# Patient Record
Sex: Male | Born: 1955
Health system: Southern US, Community
[De-identification: ages and names within clinical notes are randomized; demographics above are authoritative.]

## PROBLEM LIST (undated history)

## (undated) DIAGNOSIS — K429 Umbilical hernia without obstruction or gangrene: Secondary | ICD-10-CM

## (undated) DIAGNOSIS — K439 Ventral hernia without obstruction or gangrene: Secondary | ICD-10-CM

## (undated) DIAGNOSIS — E739 Lactose intolerance, unspecified: Secondary | ICD-10-CM

## (undated) DIAGNOSIS — E669 Obesity, unspecified: Secondary | ICD-10-CM

## (undated) DIAGNOSIS — K4020 Bilateral inguinal hernia, without obstruction or gangrene, not specified as recurrent: Secondary | ICD-10-CM

## (undated) DIAGNOSIS — I10 Essential (primary) hypertension: Secondary | ICD-10-CM

## (undated) DIAGNOSIS — K4090 Unilateral inguinal hernia, without obstruction or gangrene, not specified as recurrent: Secondary | ICD-10-CM

## (undated) DIAGNOSIS — J45909 Unspecified asthma, uncomplicated: Secondary | ICD-10-CM

## (undated) HISTORY — DX: Bilateral inguinal hernia, without obstruction or gangrene, not specified as recurrent: K40.20

## (undated) HISTORY — DX: Obesity, unspecified: E66.9

## (undated) HISTORY — DX: Ventral hernia without obstruction or gangrene: K43.9

---

## 2016-01-20 ENCOUNTER — Other Ambulatory Visit: Payer: Self-pay | Admitting: Family Medicine

## 2016-01-20 DIAGNOSIS — R911 Solitary pulmonary nodule: Secondary | ICD-10-CM

## 2016-02-01 ENCOUNTER — Ambulatory Visit
Admission: RE | Admit: 2016-02-01 | Discharge: 2016-02-01 | Disposition: A | Discharge status: Home / Self Care | Payer: Managed Care, Other (non HMO) | Source: Ambulatory Visit | Attending: Family Medicine | Admitting: Family Medicine

## 2016-02-01 DIAGNOSIS — R911 Solitary pulmonary nodule: Secondary | ICD-10-CM

## 2016-02-01 MED ORDER — IOPAMIDOL (ISOVUE-300) INJECTION 61%
75.0000 mL | Freq: Once | INTRAVENOUS | Status: AC | PRN
Start: 2016-02-01 — End: 2016-02-01
  Administered 2016-02-01: 75 mL via INTRAVENOUS

## 2016-02-22 ENCOUNTER — Ambulatory Visit: Payer: Self-pay | Admitting: Surgery

## 2016-02-22 NOTE — H&P (Signed)
Joshua Craig 02/22/2016 8:36 AM Location: [facility] Patient #: [id number] DOB: 1955-07-17 Married / Language: English / Race: White Male   History of Present Illness Ardeth Sportsman MD; 02/22/2016 9:31 AM) The patient is a 59 year old male who presents with an inguinal hernia. Note for "Inguinal hernia": Patient sent for surgical consultation by Dr. Duane Lope for concern of worsening inguinal hernia and new periumbilical hernia.  Pleasant active male. Works as a IT trainer. Has had a groin hernia for many years. He recall seeing another surgeon in the group perhaps 10 years ago. Open inguinal hernia repair offered. He held off. Its gradually gotten larger. He's been wearing a truss. Usually wears one out after year. However the truss is not working as well and is more comfortable to use. She noted he has had a new lump above his belly button as well. Concern for hernia there as well. Wish to reconsider hernia surgery. He is a smoker but is been using Chantix 3 months and is nearly cutback. His wife still smokes. He can walk several miles with his blood pounds on [place] without difficulty. No exertional chest pain or shortness of breath. He's never had any abdominal surgeries. No history of skin infections. He did have some chronic asthma chest tightness and bronchitis. Partially improved on doxycycline. Recent chest CT ruled out any concerning lung lesions. Hernias not seen as the cuts were higher.   Other Problems Lamar Laundry Bynum, CMA; 02/22/2016 8:36 AM) Asthma Chronic Obstructive Lung Disease High blood pressure Inguinal Hernia Umbilical Hernia Repair  Past Surgical History Gilmer Mor, CMA; 02/22/2016 8:36 AM) No pertinent past surgical history  Diagnostic Studies History Gilmer Mor, CMA; 02/22/2016 8:36 AM) Colonoscopy never  Allergies Lamar Laundry Bynum, CMA; 02/22/2016 8:37 AM) No Known Drug Allergies10/01/2016  Medication History (Alisha  Spillers, CMA; 02/22/2016 9:09 AM) Advair Diskus (250-50MCG/DOSE Aero Pow Br Act, Inhalation) Active. Lisinopril (20MG  Tablet, Oral) Active. Medications Reconciled Baby Aspirin (81MG  Tablet Chewable, Oral) Active. Multivitamin Adults (Oral) Active. Chantix (1MG  Tablet, Oral) Active.  Social History Gilmer Mor, CMA; 02/22/2016 8:36 AM) Alcohol use Occasional alcohol use. Caffeine use Coffee, Tea. Illicit drug use Remotely quit drug use. Tobacco use Current some day smoker.  Family History Gilmer Mor, CMA; 02/22/2016 8:36 AM) Arthritis Mother. Hypertension Mother.    Review of Systems Lamar Laundry Bynum CMA; 02/22/2016 8:36 AM) General Not Present- Appetite Loss, Chills, Fatigue, Fever, Night Sweats, Weight Gain and Weight Loss. Skin Not Present- Change in Wart/Mole, Dryness, Hives, Jaundice, New Lesions, Non-Healing Wounds, Rash and Ulcer. HEENT Not Present- Earache, Hearing Loss, Hoarseness, Nose Bleed, Oral Ulcers, Ringing in the Ears, Seasonal Allergies, Sinus Pain, Sore Throat, Visual Disturbances, Wears glasses/contact lenses and Yellow Eyes. Respiratory Not Present- Bloody sputum, Chronic Cough, Difficulty Breathing, Snoring and Wheezing. Breast Not Present- Breast Mass, Breast Pain, Nipple Discharge and Skin Changes. Cardiovascular Not Present- Chest Pain, Difficulty Breathing Lying Down, Leg Cramps, Palpitations, Rapid Heart Rate, Shortness of Breath and Swelling of Extremities. Gastrointestinal Not Present- Abdominal Pain, Bloating, Bloody Stool, Change in Bowel Habits, Chronic diarrhea, Constipation, Difficulty Swallowing, Excessive gas, Gets full quickly at meals, Hemorrhoids, Indigestion, Nausea, Rectal Pain and Vomiting. Male Genitourinary Not Present- Blood in Urine, Change in Urinary Stream, Frequency, Impotence, Nocturia, Painful Urination, Urgency and Urine Leakage. Musculoskeletal Not Present- Back Pain, Joint Pain, Joint Stiffness, Muscle Pain, Muscle Weakness and  Swelling of Extremities. Neurological Not Present- Decreased Memory, Fainting, Headaches, Numbness, Seizures, Tingling, Tremor, Trouble walking and Weakness. Psychiatric Not Present- Anxiety, Bipolar,  Change in Sleep Pattern, Depression, Fearful and Frequent crying. Endocrine Not Present- Cold Intolerance, Excessive Hunger, Hair Changes, Heat Intolerance, Hot flashes and New Diabetes. Hematology Not Present- Blood Thinners, Easy Bruising, Excessive bleeding, Gland problems, HIV and Persistent Infections.  Vitals (Sonya Bynum CMA; 02/22/2016 8:37 AM) 02/22/2016 8:37 AM Weight: 212 lb Height: 72in Body Surface Area: 2.18 m Body Mass Index: 28.75 kg/m  Temp.: 98.25F(Temporal)  Pulse: 77 (Regular)  BP: 130/74 (Sitting, Left Arm, Standard)       Physical Exam Ardeth Sportsman MD; 02/22/2016 9:25 AM) General Mental Status-Alert. General Appearance-Not in acute distress, Not Sickly. Orientation-Oriented X3. Hydration-Well hydrated. Voice-Normal.  Integumentary Global Assessment Upon inspection and palpation of skin surfaces of the - Axillae: non-tender, no inflammation or ulceration, no drainage. and Distribution of scalp and body hair is normal. General Characteristics Temperature - normal warmth is noted.  Head and Neck Head-normocephalic, atraumatic with no lesions or palpable masses. Face Global Assessment - atraumatic, no absence of expression. Neck Global Assessment - no abnormal movements, no bruit auscultated on the right, no bruit auscultated on the left, no decreased range of motion, non-tender. Trachea-midline. Thyroid Gland Characteristics - non-tender.  Eye Eyeball - Left-Extraocular movements intact, No Nystagmus. Eyeball - Right-Extraocular movements intact, No Nystagmus. Cornea - Left-No Hazy. Cornea - Right-No Hazy. Sclera/Conjunctiva - Left-No scleral icterus, No Discharge. Sclera/Conjunctiva - Right-No scleral icterus,  No Discharge. Pupil - Left-Direct reaction to light normal. Pupil - Right-Direct reaction to light normal.  ENMT Ears Pinna - Left - no drainage observed, no generalized tenderness observed. Right - no drainage observed, no generalized tenderness observed. Nose and Sinuses External Inspection of the Nose - no destructive lesion observed. Inspection of the nares - Left - quiet respiration. Right - quiet respiration. Mouth and Throat Lips - Upper Lip - no fissures observed, no pallor noted. Lower Lip - no fissures observed, no pallor noted. Nasopharynx - no discharge present. Oral Cavity/Oropharynx - Tongue - no dryness observed. Oral Mucosa - no cyanosis observed. Hypopharynx - no evidence of airway distress observed.  Chest and Lung Exam Inspection Movements - Normal and Symmetrical. Accessory muscles - No use of accessory muscles in breathing. Palpation Palpation of the chest reveals - Non-tender. Auscultation Breath sounds - Normal and Clear.  Cardiovascular Auscultation Rhythm - Regular. Murmurs & Other Heart Sounds - Auscultation of the heart reveals - No Murmurs and No Systolic Clicks.  Abdomen Inspection Inspection of the abdomen reveals - No Visible peristalsis and No Abnormal pulsations. Umbilicus - No Bleeding, No Urine drainage. Palpation/Percussion Palpation and Percussion of the abdomen reveal - Soft, Non Tender, No Rebound tenderness, No Rigidity (guarding) and No Cutaneous hyperesthesia. Note: Obese but soft. Mild diastases. Slight supraumbilical 4x4cm bulging asymmetry reduces down to a supraumbilical hernia. Mild laxity of the umbilical stalk but no true umbilical hernia there. Nontender, nondistended. No guarding.   Male Genitourinary Sexual Maturity Tanner 5 - Adult hair pattern and Adult penile size and shape.  Peripheral Vascular Upper Extremity Inspection - Left - No Cyanotic nailbeds, Not Ischemic. Right - No Cyanotic nailbeds, Not  Ischemic.  Neurologic Neurologic evaluation reveals -normal attention span and ability to concentrate, able to name objects and repeat phrases. Appropriate fund of knowledge , normal sensation and normal coordination. Mental Status Affect - not angry, not paranoid. Cranial Nerves-Normal Bilaterally. Gait-Normal.  Neuropsychiatric Mental status exam performed with findings of-able to articulate well with normal speech/language, rate, volume and coherence, thought content normal with ability to perform basic computations  and apply abstract reasoning and no evidence of hallucinations, delusions, obsessions or homicidal/suicidal ideation.  Musculoskeletal Global Assessment Spine, Ribs and Pelvis - no instability, subluxation or laxity. Right Upper Extremity - no instability, subluxation or laxity.  Lymphatic Head & Neck  General Head & Neck Lymphatics: Bilateral - Description - No Localized lymphadenopathy. Axillary  General Axillary Region: Bilateral - Description - No Localized lymphadenopathy. Femoral & Inguinal  Generalized Femoral & Inguinal Lymphatics: Left - Description - No Localized lymphadenopathy. Right - Description - No Localized lymphadenopathy.    Assessment & Plan Ardeth Sportsman(Analiah Drum C. Brailee Riede MD; 02/22/2016 9:28 AM) LEFT INGUINAL HERNIA (K40.90) Impression: Large left inguinal hernia, reducible. Mild impulse on right but no major hernia there.  I think he would benefit from repair. Offered laparoscopic exploration & repair of hernias found. I would have a low threshold to repair the contralateral side if there is any evidence of hernia there. He is interested in making sure that all potential hernias were repaired. PREOP - ING HERNIA - ENCOUNTER FOR PREOPERATIVE EXAMINATION FOR GENERAL SURGICAL PROCEDURE (Z01.818) Current Plans You are being scheduled for surgery - Our schedulers will call you.  You should hear from our office's scheduling department within 5 working days  about the location, date, and time of surgery. We try to make accommodations for patient's preferences in scheduling surgery, but sometimes the OR schedule or the surgeon's schedule prevents us from making those accommodations.  If you have not heard from our office [phone] in 5 working days, call the office and ask for your surgeon's nurse.  If you have other questions about your diagnosis, plan, or surgery, call the office and ask for your surgeon's nurse.  Written instructions provided The anatomy & physiology of the abdominal wall and pelvic floor was discussed. The pathophysiology of hernias in the inguinal and pelvic region was discussed. Natural history risks such as progressive enlargement, pain, incarceration, and strangulation was discussed. Contributors to complications such as smoking, obesity, diabetes, prior surgery, etc were discussed.  I feel the risks of no intervention will lead to serious problems that outweigh the operative risks; therefore, I recommended surgery to reduce and repair the hernia. I explained laparoscopic techniques with possible need for an open approach. I noted usual use of mesh to patch and/or buttress hernia repair  Risks such as bleeding, infection, abscess, need for further treatment, heart attack, death, and other risks were discussed. I noted a good likelihood this will help address the problem. Goals of post-operative recovery were discussed as well. Possibility that this will not correct all symptoms was explained. I stressed the importance of low-impact activity, aggressive pain control, avoiding constipation, & not pushing through pain to minimize risk of post-operative chronic pain or injury. Possibility of reherniation was discussed. We will work to minimize complications.  An educational handout further explaining the pathology & treatment options was given as well. Questions were answered. The patient expresses understanding & wishes to proceed  with surgery.  Pt Education - Pamphlet Given - Laparoscopic Hernia Repair: discussed with patient and provided information. Pt Education - [facility] Hernia Post-Op HCI ([place]ia Bradshaw): discussed with patient and provided information. EPIGASTRIC HERNIA (K43.9) Impression: Small but definite supraumbilical hernia. Eating larger. Sensitive.  I think he would benefit from repair. Given his activity and job requirements, most likely would do underlay mesh repair. Perhaps small enough just to do stitches. PREOP - VWH - ENCOUNTER FOR PREOPERATIVE EXAMINATION FOR GENERAL SURGICAL PROCEDURE (Z01.818) Current Plans You are being scheduled for surgery -  Our schedulers will call you.  You should hear from our office's scheduling department within 5 working days about the location, date, and time of surgery. We try to make accommodations for patient's preferences in scheduling surgery, but sometimes the OR schedule or the surgeon's schedule prevents [place] from making those accommodations.  If you have not heard from our office [phone]) in 5 working days, call the office and ask for your surgeon's nurse.  If you have other questions about your diagnosis, plan, or surgery, call the office and ask for your surgeon's nurse.  Written instructions provided The anatomy & physiology of the abdominal wall was discussed. The pathophysiology of hernias was discussed. Natural history risks without surgery including progeressive enlargement, pain, incarceration, & strangulation was discussed. Contributors to complications such as smoking, obesity, diabetes, prior surgery, etc were discussed.  I feel the risks of no intervention will lead to serious problems that outweigh the operative risks; therefore, I recommended surgery to reduce and repair the hernia. I explained laparoscopic techniques with possible need for an open approach. I noted the probable use of mesh to patch and/or buttress the hernia repair  Risks such as  bleeding, infection, abscess, need for further treatment, heart attack, death, and other risks were discussed. I noted a good likelihood this will help address the problem. Goals of post-operative recovery were discussed as well. Possibility that this will not correct all symptoms was explained. I stressed the importance of low-impact activity, aggressive pain control, avoiding constipation, & not pushing through pain to minimize risk of post-operative chronic pain or injury. Possibility of reherniation especially with smoking, obesity, diabetes, immunosuppression, and other health conditions was discussed. We will work to minimize complications.  An educational handout further explaining the pathology & treatment options was given as well. Questions were answered. The patient expresses understanding & wishes to proceed with surgery.  TOBACCO ABUSE (Z72.0) Current Plans Pt Education - [facility] STOP SMOKING!  Ardeth Sportsman, M.D., F.A.C.S. Gastrointestinal and Minimally Invasive Surgery [facility] [address] [place], [place] [address] [phone] Main / Paging

## 2016-04-12 ENCOUNTER — Encounter (HOSPITAL_COMMUNITY)
Admission: RE | Admit: 2016-04-12 | Discharge: 2016-04-12 | Disposition: A | Discharge status: Home / Self Care | Payer: Managed Care, Other (non HMO) | Source: Ambulatory Visit | Attending: Surgery | Admitting: Surgery

## 2016-04-12 ENCOUNTER — Ambulatory Visit (HOSPITAL_COMMUNITY)
Admission: RE | Admit: 2016-04-12 | Discharge: 2016-04-12 | Disposition: A | Discharge status: Home / Self Care | Payer: Managed Care, Other (non HMO) | Source: Ambulatory Visit | Attending: Anesthesiology | Admitting: Anesthesiology

## 2016-04-12 ENCOUNTER — Encounter (HOSPITAL_COMMUNITY): Payer: Self-pay

## 2016-04-12 DIAGNOSIS — R05 Cough: Secondary | ICD-10-CM | POA: Diagnosis present

## 2016-04-12 DIAGNOSIS — R059 Cough, unspecified: Secondary | ICD-10-CM

## 2016-04-12 HISTORY — DX: Essential (primary) hypertension: I10

## 2016-04-12 HISTORY — DX: Unspecified asthma, uncomplicated: J45.909

## 2016-04-12 HISTORY — DX: Lactose intolerance, unspecified: E73.9

## 2016-04-12 HISTORY — DX: Umbilical hernia without obstruction or gangrene: K42.9

## 2016-04-12 HISTORY — DX: Unilateral inguinal hernia, without obstruction or gangrene, not specified as recurrent: K40.90

## 2016-04-12 LAB — BASIC METABOLIC PANEL
ANION GAP: 7 (ref 5–15)
BUN: 10 mg/dL (ref 6–20)
CALCIUM: 9.5 mg/dL (ref 8.9–10.3)
CO2: 27 mmol/L (ref 22–32)
Chloride: 105 mmol/L (ref 101–111)
Creatinine, Ser: 0.95 mg/dL (ref 0.61–1.24)
GFR calc Af Amer: >60 mL/min (ref >60)
GLUCOSE: 90 mg/dL (ref 65–99)
Potassium: 4.8 mmol/L (ref 3.5–5.1)
Sodium: 139 mmol/L (ref 135–145)

## 2016-04-12 LAB — CBC
HCT: 47.6 % (ref 39.0–52.0)
HEMOGLOBIN: 16.7 g/dL (ref 13.0–17.0)
MCH: 32.4 pg (ref 26.0–34.0)
MCHC: 35.1 g/dL (ref 30.0–36.0)
MCV: 92.2 fL (ref 78.0–100.0)
Platelets: 217 10*3/uL (ref 150–400)
RBC: 5.16 MIL/uL (ref 4.22–5.81)
RDW: 13.5 % (ref 11.5–15.5)
WBC: 8.9 10*3/uL (ref 4.0–10.5)

## 2016-04-12 NOTE — Pre-Procedure Instructions (Signed)
Joshua AsalJames W Craig  04/12/2016      [facility] [place] - [id number] [place] [address] Phone: [phone] Fax: [phone]    Your procedure is scheduled on December 7  [facility] at 0530 A.M.  Call this number if you have problems the morning of surgery:  [phone]   Remember:  Do not eat food or drink liquids after midnight.   Take these medicines the morning of surgery with A SIP OF WATER ADVAIR DISKUS, albuterol (PROVENTIL HFA;VENTOLIN HFA) bring albuterol inhaler with you the day of surgery  7 days prior to surgery STOP taking any Aspirin, Aleve, Naproxen, Ibuprofen, Motrin, Advil, Goody's, BC's, all herbal medications, fish oil, and all vitamins    Do not wear jewelry.  Do not wear lotions, powders, or cologne, or deoderant.  Men may shave face and neck.  Do not bring valuables to the hospital.  [facility] is not responsible for any belongings or valuables.  Contacts, dentures or bridgework may not be worn into surgery.  Leave your suitcase in the car.  After surgery it may be brought to your room.  For patients admitted to the hospital, discharge time will be determined by your treatment team.  Patients discharged the day of surgery will not be allowed to drive home.    Special instructions:   [place]- Preparing For Surgery  Before surgery, you can play an important role. Because skin is not sterile, your skin needs to be as free of germs as possible. You can reduce the number of germs on your skin by washing with CHG (chlorahexidine gluconate) Soap before surgery.  CHG is an antiseptic cleaner which kills germs and bonds with the skin to continue killing germs even after washing.  Please do not use if you have an allergy to CHG or antibacterial soaps. If your skin becomes reddened/irritated stop using the CHG.  Do not shave (including legs and underarms) for at least 48  hours prior to first CHG shower. It is OK to shave your face.  Please follow these instructions carefully.   1. Shower the NIGHT BEFORE SURGERY and the MORNING OF SURGERY with CHG.   2. If you chose to wash your hair, wash your hair first as usual with your normal shampoo.  3. After you shampoo, rinse your hair and body thoroughly to remove the shampoo.  4. Use CHG as you would any other liquid soap. You can apply CHG directly to the skin and wash gently with a scrungie or a clean washcloth.   5. Apply the CHG Soap to your body ONLY FROM THE NECK DOWN.  Do not use on open wounds or open sores. Avoid contact with your eyes, ears, mouth and genitals (private parts). Wash genitals (private parts) with your normal soap.  6. Wash thoroughly, paying special attention to the area where your surgery will be performed.  7. Thoroughly rinse your body with warm water from the neck down.  8. DO NOT shower/wash with your normal soap after using and rinsing off the CHG Soap.  9. Pat yourself dry with a CLEAN TOWEL.   10. Wear CLEAN PAJAMAS   11. Place CLEAN SHEETS on your bed the night of your first shower and DO NOT SLEEP WITH PETS.    Day of Surgery: Do not apply any deodorants/lotions. Please wear clean clothes to the hospital/surgery center.      Please read  over the following fact sheets that you were given.

## 2016-04-12 NOTE — Progress Notes (Signed)
PCP - Dorthey Sawyerharles Allen Ross Cardiologist - denies   Chest x-ray - 04/12/16 EKG - requesting Stress Test - denies ECHO - denies Cardiac Cath - denies    Patient denies shortness of breath, fever, and chest pain at PAT appointment  Patient has a recent diagnosis of bronchitis and is being treated with an antiziotic

## 2016-04-14 NOTE — Progress Notes (Addendum)
Anesthesia Chart Review:  Pt is a 60 year old male scheduled for laparoscopic L inguinal hernia repair, possible R, with supraumbilical ventral wall hernia repair, insertion of mesh on 04/21/2016 with Karie SodaSteven Gross, MD.   - PCP is C. Duane LopeAlan Ross, MD  PMH includes:  HTN, asthma. Current smoker. BMI 30  Medications include: advair, albuterol, ASA, lisinopril  Preoperative labs reviewed.    CXR 04/12/16: No evidence of active cardiopulmonary disease.  EKG requested from PCP's office but they do not have one. Will get EKG DOS.   Pt reported at PAT he was recently treated with antibiotics for bronchitis.   If EKG acceptable DOS and pt has recovered from bronchitis, I anticipate pt can proceed as scheduled.   Rica Mastngela Darvin Dials, FNP-BC [facility]Anesthesiology Phone: [phone] 04/14/2016 1:53 PM

## 2016-04-20 MED ORDER — BUPIVACAINE LIPOSOME 1.3 % IJ SUSP: 20.0000 mL | INTRAMUSCULAR | Status: DC

## 2016-04-21 ENCOUNTER — Encounter (HOSPITAL_COMMUNITY): Payer: Self-pay | Admitting: Anesthesiology

## 2016-04-21 ENCOUNTER — Ambulatory Visit (HOSPITAL_COMMUNITY): Payer: Managed Care, Other (non HMO) | Admitting: Anesthesiology

## 2016-04-21 ENCOUNTER — Ambulatory Visit (HOSPITAL_COMMUNITY): Payer: Managed Care, Other (non HMO) | Admitting: Emergency Medicine

## 2016-04-21 ENCOUNTER — Ambulatory Visit (HOSPITAL_COMMUNITY)
Admission: RE | Admit: 2016-04-21 | Discharge: 2016-04-21 | Disposition: A | Discharge status: Home / Self Care | Payer: Managed Care, Other (non HMO) | Source: Ambulatory Visit | Attending: Surgery | Admitting: Surgery

## 2016-04-21 ENCOUNTER — Encounter (HOSPITAL_COMMUNITY)
Admission: RE | Disposition: A | Discharge status: Home / Self Care | Payer: Self-pay | Source: Ambulatory Visit | Attending: Surgery

## 2016-04-21 DIAGNOSIS — Z7982 Long term (current) use of aspirin: Secondary | ICD-10-CM | POA: Diagnosis not present

## 2016-04-21 DIAGNOSIS — F17210 Nicotine dependence, cigarettes, uncomplicated: Secondary | ICD-10-CM | POA: Diagnosis not present

## 2016-04-21 DIAGNOSIS — Z8249 Family history of ischemic heart disease and other diseases of the circulatory system: Secondary | ICD-10-CM | POA: Diagnosis not present

## 2016-04-21 DIAGNOSIS — I10 Essential (primary) hypertension: Secondary | ICD-10-CM | POA: Diagnosis not present

## 2016-04-21 DIAGNOSIS — K439 Ventral hernia without obstruction or gangrene: Secondary | ICD-10-CM

## 2016-04-21 DIAGNOSIS — K436 Other and unspecified ventral hernia with obstruction, without gangrene: Secondary | ICD-10-CM | POA: Insufficient documentation

## 2016-04-21 DIAGNOSIS — K4190 Unilateral femoral hernia, without obstruction or gangrene, not specified as recurrent: Secondary | ICD-10-CM | POA: Diagnosis not present

## 2016-04-21 DIAGNOSIS — E739 Lactose intolerance, unspecified: Secondary | ICD-10-CM | POA: Diagnosis not present

## 2016-04-21 DIAGNOSIS — K4020 Bilateral inguinal hernia, without obstruction or gangrene, not specified as recurrent: Secondary | ICD-10-CM | POA: Diagnosis not present

## 2016-04-21 DIAGNOSIS — J449 Chronic obstructive pulmonary disease, unspecified: Secondary | ICD-10-CM | POA: Diagnosis not present

## 2016-04-21 DIAGNOSIS — E669 Obesity, unspecified: Secondary | ICD-10-CM

## 2016-04-21 DIAGNOSIS — K429 Umbilical hernia without obstruction or gangrene: Secondary | ICD-10-CM | POA: Insufficient documentation

## 2016-04-21 DIAGNOSIS — Z881 Allergy status to other antibiotic agents status: Secondary | ICD-10-CM | POA: Insufficient documentation

## 2016-04-21 HISTORY — DX: Ventral hernia without obstruction or gangrene: K43.9

## 2016-04-21 HISTORY — PX: INSERTION OF MESH: SHX5868

## 2016-04-21 HISTORY — DX: Obesity, unspecified: E66.9

## 2016-04-21 HISTORY — DX: Bilateral inguinal hernia, without obstruction or gangrene, not specified as recurrent: K40.20

## 2016-04-21 HISTORY — PX: LAPAROSCOPIC INGUINAL HERNIA WITH UMBILICAL HERNIA: SHX5658

## 2016-04-21 SURGERY — LAPAROSCOPIC INGUINAL HERNIA WITH UMBILICAL HERNIA
Anesthesia: General | Site: Groin | Laterality: Bilateral

## 2016-04-21 MED ORDER — CHLORHEXIDINE GLUCONATE CLOTH 2 % EX PADS: 6.0000 | MEDICATED_PAD | Freq: Once | CUTANEOUS | Status: DC

## 2016-04-21 MED ORDER — FENTANYL CITRATE (PF) 100 MCG/2ML IJ SOLN
INTRAMUSCULAR | Status: AC
  Filled 2016-04-21: qty 2

## 2016-04-21 MED ORDER — OXYCODONE HCL 5 MG/5ML PO SOLN: 5.0000 mg | Freq: Once | ORAL | Status: DC | PRN

## 2016-04-21 MED ORDER — HYDROMORPHONE HCL 1 MG/ML IJ SOLN
INTRAMUSCULAR | Status: DC
Start: 2016-04-21 — End: 2016-04-21
  Filled 2016-04-21: qty 0.5

## 2016-04-21 MED ORDER — BUPIVACAINE-EPINEPHRINE (PF) 0.25% -1:200000 IJ SOLN
INTRAMUSCULAR | Status: AC
  Filled 2016-04-21: qty 30

## 2016-04-21 MED ORDER — ALBUTEROL SULFATE (2.5 MG/3ML) 0.083% IN NEBU
2.5000 mg | INHALATION_SOLUTION | Freq: Once | RESPIRATORY_TRACT | Status: AC
  Administered 2016-04-21: 2.5 mg via RESPIRATORY_TRACT

## 2016-04-21 MED ORDER — NAPROXEN 500 MG PO TABS: 500.0000 mg | ORAL_TABLET | Freq: Two times a day (BID) | ORAL | 1 refills | Status: DC | PRN

## 2016-04-21 MED ORDER — BUPIVACAINE LIPOSOME 1.3 % IJ SUSP
20.0000 mL | INTRAMUSCULAR | Status: DC
Start: 2016-04-21 — End: 2016-04-21
  Filled 2016-04-21: qty 20

## 2016-04-21 MED ORDER — PHENYLEPHRINE 40 MCG/ML (10ML) SYRINGE FOR IV PUSH (FOR BLOOD PRESSURE SUPPORT)
PREFILLED_SYRINGE | INTRAVENOUS | Status: AC
  Filled 2016-04-21: qty 10

## 2016-04-21 MED ORDER — OXYCODONE HCL 5 MG PO TABS: 5.0000 mg | ORAL_TABLET | Freq: Once | ORAL | Status: DC | PRN

## 2016-04-21 MED ORDER — PROPOFOL 10 MG/ML IV BOLUS
INTRAVENOUS | Status: DC | PRN
  Administered 2016-04-21: 190 mg via INTRAVENOUS

## 2016-04-21 MED ORDER — FENTANYL CITRATE (PF) 100 MCG/2ML IJ SOLN
INTRAMUSCULAR | Status: DC | PRN
  Administered 2016-04-21: 50 ug via INTRAVENOUS
  Administered 2016-04-21: 100 ug via INTRAVENOUS
  Administered 2016-04-21: 50 ug via INTRAVENOUS

## 2016-04-21 MED ORDER — MIDAZOLAM HCL 5 MG/5ML IJ SOLN
INTRAMUSCULAR | Status: DC | PRN
  Administered 2016-04-21: 2 mg via INTRAVENOUS

## 2016-04-21 MED ORDER — ACETAMINOPHEN 500 MG PO TABS
1000.0000 mg | ORAL_TABLET | ORAL | Status: AC
  Administered 2016-04-21: 1000 mg via ORAL
  Filled 2016-04-21: qty 2

## 2016-04-21 MED ORDER — ROCURONIUM BROMIDE 10 MG/ML (PF) SYRINGE
PREFILLED_SYRINGE | INTRAVENOUS | Status: AC
  Filled 2016-04-21: qty 20

## 2016-04-21 MED ORDER — LACTATED RINGERS IV SOLN
INTRAVENOUS | Status: DC | PRN
  Administered 2016-04-21: 07:00:00 via INTRAVENOUS

## 2016-04-21 MED ORDER — CELECOXIB 200 MG PO CAPS
400.0000 mg | ORAL_CAPSULE | ORAL | Status: AC
  Administered 2016-04-21: 400 mg via ORAL
  Filled 2016-04-21: qty 2

## 2016-04-21 MED ORDER — SUGAMMADEX SODIUM 200 MG/2ML IV SOLN
INTRAVENOUS | Status: DC | PRN
  Administered 2016-04-21: 200 mg via INTRAVENOUS

## 2016-04-21 MED ORDER — TRAMADOL HCL 50 MG PO TABS: 50.0000 mg | ORAL_TABLET | Freq: Four times a day (QID) | ORAL | 0 refills | Status: DC | PRN

## 2016-04-21 MED ORDER — HYDROMORPHONE HCL 1 MG/ML IJ SOLN
0.2500 mg | INTRAMUSCULAR | Status: DC | PRN
  Administered 2016-04-21 (×2): 0.5 mg via INTRAVENOUS

## 2016-04-21 MED ORDER — BUPIVACAINE-EPINEPHRINE (PF) 0.25% -1:200000 IJ SOLN
INTRAMUSCULAR | Status: AC
  Filled 2016-04-21: qty 60

## 2016-04-21 MED ORDER — ARTIFICIAL TEARS OP OINT
TOPICAL_OINTMENT | OPHTHALMIC | Status: AC
  Filled 2016-04-21: qty 3.5

## 2016-04-21 MED ORDER — BUPIVACAINE LIPOSOME 1.3 % IJ SUSP
INTRAMUSCULAR | Status: DC | PRN
  Administered 2016-04-21: 20 mL

## 2016-04-21 MED ORDER — ROCURONIUM BROMIDE 100 MG/10ML IV SOLN
INTRAVENOUS | Status: DC | PRN
  Administered 2016-04-21: 20 mg via INTRAVENOUS
  Administered 2016-04-21: 50 mg via INTRAVENOUS
  Administered 2016-04-21: 30 mg via INTRAVENOUS
  Administered 2016-04-21: 20 mg via INTRAVENOUS
  Administered 2016-04-21 (×2): 10 mg via INTRAVENOUS

## 2016-04-21 MED ORDER — PHENYLEPHRINE HCL 10 MG/ML IJ SOLN
INTRAMUSCULAR | Status: DC | PRN
  Administered 2016-04-21: 15 ug/min via INTRAVENOUS

## 2016-04-21 MED ORDER — ALBUTEROL SULFATE (2.5 MG/3ML) 0.083% IN NEBU: 2.5000 mg | INHALATION_SOLUTION | Freq: Four times a day (QID) | RESPIRATORY_TRACT | Status: DC | PRN

## 2016-04-21 MED ORDER — LIDOCAINE 2% (20 MG/ML) 5 ML SYRINGE
INTRAMUSCULAR | Status: AC
  Filled 2016-04-21: qty 5

## 2016-04-21 MED ORDER — LABETALOL HCL 5 MG/ML IV SOLN
INTRAVENOUS | Status: AC
  Filled 2016-04-21: qty 4

## 2016-04-21 MED ORDER — ALBUTEROL SULFATE (2.5 MG/3ML) 0.083% IN NEBU
INHALATION_SOLUTION | RESPIRATORY_TRACT | Status: AC
  Filled 2016-04-21: qty 3

## 2016-04-21 MED ORDER — ONDANSETRON HCL 4 MG/2ML IJ SOLN
INTRAMUSCULAR | Status: AC
  Filled 2016-04-21: qty 2

## 2016-04-21 MED ORDER — ONDANSETRON HCL 4 MG/2ML IJ SOLN
INTRAMUSCULAR | Status: DC | PRN
  Administered 2016-04-21: 4 mg via INTRAVENOUS

## 2016-04-21 MED ORDER — BUPIVACAINE-EPINEPHRINE 0.25% -1:200000 IJ SOLN
INTRAMUSCULAR | Status: DC | PRN
  Administered 2016-04-21 (×3): 30 mL

## 2016-04-21 MED ORDER — LIDOCAINE HCL (CARDIAC) 20 MG/ML IV SOLN
INTRAVENOUS | Status: DC | PRN
  Administered 2016-04-21: 60 mg via INTRAVENOUS

## 2016-04-21 MED ORDER — PROPOFOL 10 MG/ML IV BOLUS
INTRAVENOUS | Status: AC
  Filled 2016-04-21: qty 20

## 2016-04-21 MED ORDER — ONDANSETRON HCL 4 MG/2ML IJ SOLN: 4.0000 mg | Freq: Four times a day (QID) | INTRAMUSCULAR | Status: DC | PRN

## 2016-04-21 MED ORDER — LABETALOL HCL 5 MG/ML IV SOLN
INTRAVENOUS | Status: DC | PRN
  Administered 2016-04-21: 5 mg via INTRAVENOUS

## 2016-04-21 MED ORDER — HYDROMORPHONE HCL 1 MG/ML IJ SOLN
INTRAMUSCULAR | Status: AC
  Filled 2016-04-21: qty 0.5

## 2016-04-21 MED ORDER — SUGAMMADEX SODIUM 200 MG/2ML IV SOLN
INTRAVENOUS | Status: AC
  Filled 2016-04-21: qty 2

## 2016-04-21 MED ORDER — MIDAZOLAM HCL 2 MG/2ML IJ SOLN
INTRAMUSCULAR | Status: AC
  Filled 2016-04-21: qty 2

## 2016-04-21 MED ORDER — GABAPENTIN 300 MG PO CAPS
300.0000 mg | ORAL_CAPSULE | ORAL | Status: AC
  Administered 2016-04-21: 300 mg via ORAL
  Filled 2016-04-21: qty 1

## 2016-04-21 MED ORDER — CEFAZOLIN SODIUM-DEXTROSE 2-4 GM/100ML-% IV SOLN
2.0000 g | INTRAVENOUS | Status: AC
  Administered 2016-04-21: 2 g via INTRAVENOUS
  Filled 2016-04-21: qty 100

## 2016-04-21 MED ORDER — PHENYLEPHRINE HCL 10 MG/ML IJ SOLN
INTRAMUSCULAR | Status: DC | PRN
  Administered 2016-04-21 (×2): 80 ug via INTRAVENOUS

## 2016-04-21 MED ORDER — 0.9 % SODIUM CHLORIDE (POUR BTL) OPTIME
TOPICAL | Status: DC | PRN
  Administered 2016-04-21: 1000 mL

## 2016-04-21 SURGICAL SUPPLY — 50 items
APL SKNCLS STERI-STRIP NONHPOA (GAUZE/BANDAGES/DRESSINGS) ×1
BENZOIN TINCTURE PRP APPL 2/3 (GAUZE/BANDAGES/DRESSINGS) ×2 IMPLANT
BINDER ABDOMINAL 12 ML 46-62 (SOFTGOODS) ×2 IMPLANT
CHLORAPREP W/TINT 26ML (MISCELLANEOUS) ×3 IMPLANT
CLOSURE WOUND 1/2 X4 (GAUZE/BANDAGES/DRESSINGS) ×2
COVER SURGICAL LIGHT HANDLE (MISCELLANEOUS) ×3 IMPLANT
DECANTER SPIKE VIAL GLASS SM (MISCELLANEOUS) ×6 IMPLANT
DEVICE SECURE STRAP 25 ABSORB (INSTRUMENTS) ×2 IMPLANT
DEVICE TROCAR PUNCTURE CLOSURE (ENDOMECHANICALS) ×4 IMPLANT
DRAPE LAPAROSCOPIC ABDOMINAL (DRAPES) ×3 IMPLANT
DRAPE WARM FLUID 44X44 (DRAPE) ×3 IMPLANT
DRSG TEGADERM 2-3/8X2-3/4 SM (GAUZE/BANDAGES/DRESSINGS) ×10 IMPLANT
DRSG TEGADERM 4X4.75 (GAUZE/BANDAGES/DRESSINGS) ×5 IMPLANT
ELECT REM PT RETURN 9FT ADLT (ELECTROSURGICAL) ×3
ELECTRODE REM PT RTRN 9FT ADLT (ELECTROSURGICAL) ×1 IMPLANT
GAUZE SPONGE 2X2 8PLY STRL LF (GAUZE/BANDAGES/DRESSINGS) ×1 IMPLANT
GLOVE BIO SURGEON STRL SZ7 (GLOVE) ×2 IMPLANT
GLOVE BIO SURGEON STRL SZ8 (GLOVE) ×2 IMPLANT
GLOVE BIOGEL PI IND STRL 8 (GLOVE) ×1 IMPLANT
GLOVE BIOGEL PI IND STRL 8.5 (GLOVE) IMPLANT
GLOVE BIOGEL PI INDICATOR 8 (GLOVE) ×2
GLOVE BIOGEL PI INDICATOR 8.5 (GLOVE) ×2
GLOVE ECLIPSE 8.0 STRL XLNG CF (GLOVE) ×3 IMPLANT
GLOVE INDICATOR 7.0 STRL GRN (GLOVE) ×2 IMPLANT
GOWN STRL REUS W/ TWL LRG LVL3 (GOWN DISPOSABLE) ×2 IMPLANT
GOWN STRL REUS W/ TWL XL LVL3 (GOWN DISPOSABLE) ×1 IMPLANT
GOWN STRL REUS W/TWL LRG LVL3 (GOWN DISPOSABLE) ×3
GOWN STRL REUS W/TWL XL LVL3 (GOWN DISPOSABLE) ×6
KIT BASIN OR (CUSTOM PROCEDURE TRAY) ×3 IMPLANT
KIT ROOM TURNOVER OR (KITS) ×3 IMPLANT
MESH ULTRAPRO 6X6 15CM15CM (Mesh General) ×6 IMPLANT
MESH VENTRALIGHT ST 6X8 (Mesh Specialty) ×3 IMPLANT
MESH VENTRLGHT ELLIPSE 8X6XMFL (Mesh Specialty) IMPLANT
NS IRRIG 1000ML POUR BTL (IV SOLUTION) ×3 IMPLANT
PAD ARMBOARD 7.5X6 YLW CONV (MISCELLANEOUS) ×6 IMPLANT
SCISSORS LAP 5X35 DISP (ENDOMECHANICALS) ×2 IMPLANT
SLEEVE ENDOPATH XCEL 5M (ENDOMECHANICALS) ×5 IMPLANT
SPONGE GAUZE 2X2 STER 10/PKG (GAUZE/BANDAGES/DRESSINGS) ×4
STRIP CLOSURE SKIN 1/2X4 (GAUZE/BANDAGES/DRESSINGS) ×2 IMPLANT
SUT MNCRL AB 4-0 PS2 18 (SUTURE) ×5 IMPLANT
SUT PDS AB 1 CT  36 (SUTURE) ×4
SUT PDS AB 1 CT 36 (SUTURE) IMPLANT
SUT PROLENE 1 CT (SUTURE) ×12 IMPLANT
SUT VIC AB 3-0 SH 27 (SUTURE) ×3
SUT VIC AB 3-0 SH 27XBRD (SUTURE) IMPLANT
TOWEL OR 17X26 10 PK STRL BLUE (TOWEL DISPOSABLE) ×3 IMPLANT
TRAY LAPAROSCOPIC MC (CUSTOM PROCEDURE TRAY) ×3 IMPLANT
TROCAR XCEL BLUNT TIP 100MML (ENDOMECHANICALS) ×3 IMPLANT
TROCAR XCEL NON-BLD 5MMX100MML (ENDOMECHANICALS) ×3 IMPLANT
TUBING INSUFFLATION (TUBING) ×3 IMPLANT

## 2016-04-21 NOTE — Interval H&P Note (Signed)
History and Physical Interval Note:  04/21/2016 7:26 AM  Joshua Craig  has presented today for surgery, with the diagnosis of Hernias in left and possible right groins  Hernia above bellybutton  The various methods of treatment have been discussed with the patient and family. After consideration of risks, benefits and other options for treatment, the patient has consented to  Procedure(s): LAPAROSCOPIC LEFT INGUINAL HERNIA POSSIBLE RIGHT INGUNIAL HERNIA WITH SUPRAUMBILICAL VENTRAL WALL HERNIA (Bilateral) INSERTION OF MESH (Bilateral) as a surgical intervention .  The patient's history has been reviewed, patient examined, no change in status, stable for surgery.  I have reviewed the patient's chart and labs.  Questions were answered to the patient's satisfaction.     Joshua Craig C.

## 2016-04-21 NOTE — Transfer of Care (Signed)
Immediate Anesthesia Transfer of Care Note  Patient: Joshua Craig  Procedure(s) Performed: Procedure(s): LAPAROSCOPIC LEFT INGUINAL HERNIA POSSIBLE RIGHT INGUNIAL HERNIA WITH SUPRAUMBILICAL VENTRAL WALL HERNIA (Bilateral) INSERTION OF MESH (Bilateral)  Patient Location: PACU  Anesthesia Type:General  Level of Consciousness: awake, alert  and oriented  Airway & Oxygen Therapy: Patient Spontanous Breathing and Patient connected to nasal cannula oxygen  Post-op Assessment: Report given to RN, Post -op Vital signs reviewed and stable and Patient moving all extremities  Post vital signs: Reviewed and stable  Last Vitals:  Vitals:   04/21/16 0610  BP: (!) 152/105  Pulse: 87  Resp: 18  Temp: 37.1 C    Last Pain:  Vitals:   04/21/16 0610  TempSrc: Oral         Complications: No apparent anesthesia complications

## 2016-04-21 NOTE — H&P (Signed)
Joshua Craig 02/22/2016 8:36 AM Location: [facility] Patient #: [phone] DOB: 1955-07-22 Married / Language: English / Race: White Male   History of Present Illness  The patient is a 60 year old male who presents with an inguinal hernia. Note for "Inguinal hernia": Patient sent for surgical consultation by Dr. Duane Lope for concern of worsening inguinal hernia and new periumbilical hernia.  Pleasant active male. Works as a IT trainer. Has had a groin hernia for many years. He recall seeing another surgeon in the group perhaps 10 years ago. Open inguinal hernia repair offered. He held off. Its gradually gotten larger. He's been wearing a truss. Usually wears one out after year. However the truss is not working as well and is more comfortable to use. She noted he has had a new lump above his belly button as well. Concern for hernia there as well. Wish to reconsider hernia surgery. He is a smoker but is been using Chantix 3 months and is nearly cutback. His wife still smokes. He can walk several miles with his blood pounds on [place] without difficulty. No exertional chest pain or shortness of breath. He's never had any abdominal surgeries. No history of skin infections. He did have some chronic asthma chest tightness and bronchitis. Partially improved on doxycycline. Recent chest CT ruled out any concerning lung lesions. Hernias not seen as the cuts were higher.  No new events.  Ready for surgery  Other Problems Lamar Laundry Bynum, CMA; 02/22/2016 8:36 AM) Asthma Chronic Obstructive Lung Disease High blood pressure Inguinal Hernia Umbilical Hernia Repair  Past Surgical History Gilmer Mor, CMA; 02/22/2016 8:36 AM) No pertinent past surgical history  Diagnostic Studies History Gilmer Mor, CMA; 02/22/2016 8:36 AM) Colonoscopy never  Allergies Lamar Laundry Bynum, CMA; 02/22/2016 8:37 AM) No Known Drug Allergies10/01/2016  Medication History (Alisha Spillers,  CMA; 02/22/2016 9:09 AM) Advair Diskus (250-50MCG/DOSE Aero Pow Br Act, Inhalation) Active. Lisinopril (20MG  Tablet, Oral) Active. Medications Reconciled Baby Aspirin (81MG  Tablet Chewable, Oral) Active. Multivitamin Adults (Oral) Active. Chantix (1MG  Tablet, Oral) Active.  Social History Gilmer Mor, CMA; 02/22/2016 8:36 AM) Alcohol use Occasional alcohol use. Caffeine use Coffee, Tea. Illicit drug use Remotely quit drug use. Tobacco use Current some day smoker.  Family History Gilmer Mor, CMA; 02/22/2016 8:36 AM) Arthritis Mother. Hypertension Mother.    Review of Systems Lamar Laundry Bynum CMA; 02/22/2016 8:36 AM) General Not Present- Appetite Loss, Chills, Fatigue, Fever, Night Sweats, Weight Gain and Weight Loss. Skin Not Present- Change in Wart/Mole, Dryness, Hives, Jaundice, New Lesions, Non-Healing Wounds, Rash and Ulcer. HEENT Not Present- Earache, Hearing Loss, Hoarseness, Nose Bleed, Oral Ulcers, Ringing in the Ears, Seasonal Allergies, Sinus Pain, Sore Throat, Visual Disturbances, Wears glasses/contact lenses and Yellow Eyes. Respiratory Not Present- Bloody sputum, Chronic Cough, Difficulty Breathing, Snoring and Wheezing. Breast Not Present- Breast Mass, Breast Pain, Nipple Discharge and Skin Changes. Cardiovascular Not Present- Chest Pain, Difficulty Breathing Lying Down, Leg Cramps, Palpitations, Rapid Heart Rate, Shortness of Breath and Swelling of Extremities. Gastrointestinal Not Present- Abdominal Pain, Bloating, Bloody Stool, Change in Bowel Habits, Chronic diarrhea, Constipation, Difficulty Swallowing, Excessive gas, Gets full quickly at meals, Hemorrhoids, Indigestion, Nausea, Rectal Pain and Vomiting. Male Genitourinary Not Present- Blood in Urine, Change in Urinary Stream, Frequency, Impotence, Nocturia, Painful Urination, Urgency and Urine Leakage. Musculoskeletal Not Present- Back Pain, Joint Pain, Joint Stiffness, Muscle Pain, Muscle Weakness and Swelling  of Extremities. Neurological Not Present- Decreased Memory, Fainting, Headaches, Numbness, Seizures, Tingling, Tremor, Trouble walking and Weakness. Psychiatric Not Present- Anxiety,  Bipolar, Change in Sleep Pattern, Depression, Fearful and Frequent crying. Endocrine Not Present- Cold Intolerance, Excessive Hunger, Hair Changes, Heat Intolerance, Hot flashes and New Diabetes. Hematology Not Present- Blood Thinners, Easy Bruising, Excessive bleeding, Gland problems, HIV and Persistent Infections.  Vitals (Sonya Bynum CMA; 02/22/2016 8:37 AM) 02/22/2016 8:37 AM Weight: 212 lb Height: 72in Body Surface Area: 2.18 m Body Mass Index: 28.75 kg/m  Temp.: 98.86F(Temporal)  Pulse: 77 (Regular)  BP: 130/74 (Sitting, Left Arm, Standard)       Physical Exam Ardeth Sportsman(Scarlettrose Costilow C. Constantino Starace MD; 02/22/2016 9:25 AM) General Mental Status-Alert. General Appearance-Not in acute distress, Not Sickly. Orientation-Oriented X3. Hydration-Well hydrated. Voice-Normal.  Integumentary Global Assessment Upon inspection and palpation of skin surfaces of the - Axillae: non-tender, no inflammation or ulceration, no drainage. and Distribution of scalp and body hair is normal. General Characteristics Temperature - normal warmth is noted.  Head and Neck Head-normocephalic, atraumatic with no lesions or palpable masses. Face Global Assessment - atraumatic, no absence of expression. Neck Global Assessment - no abnormal movements, no bruit auscultated on the right, no bruit auscultated on the left, no decreased range of motion, non-tender. Trachea-midline. Thyroid Gland Characteristics - non-tender.  Eye Eyeball - Left-Extraocular movements intact, No Nystagmus. Eyeball - Right-Extraocular movements intact, No Nystagmus. Cornea - Left-No Hazy. Cornea - Right-No Hazy. Sclera/Conjunctiva - Left-No scleral icterus, No Discharge. Sclera/Conjunctiva - Right-No scleral icterus, No  Discharge. Pupil - Left-Direct reaction to light normal. Pupil - Right-Direct reaction to light normal.  ENMT Ears Pinna - Left - no drainage observed, no generalized tenderness observed. Right - no drainage observed, no generalized tenderness observed. Nose and Sinuses External Inspection of the Nose - no destructive lesion observed. Inspection of the nares - Left - quiet respiration. Right - quiet respiration. Mouth and Throat Lips - Upper Lip - no fissures observed, no pallor noted. Lower Lip - no fissures observed, no pallor noted. Nasopharynx - no discharge present. Oral Cavity/Oropharynx - Tongue - no dryness observed. Oral Mucosa - no cyanosis observed. Hypopharynx - no evidence of airway distress observed.  Chest and Lung Exam Inspection Movements - Normal and Symmetrical. Accessory muscles - No use of accessory muscles in breathing. Palpation Palpation of the chest reveals - Non-tender. Auscultation Breath sounds - Normal and Clear.  Cardiovascular Auscultation Rhythm - Regular. Murmurs & Other Heart Sounds - Auscultation of the heart reveals - No Murmurs and No Systolic Clicks.  Abdomen Inspection Inspection of the abdomen reveals - No Visible peristalsis and No Abnormal pulsations. Umbilicus - No Bleeding, No Urine drainage. Palpation/Percussion Palpation and Percussion of the abdomen reveal - Soft, Non Tender, No Rebound tenderness, No Rigidity (guarding) and No Cutaneous hyperesthesia. Note: Obese but soft. Mild diastases. Slight supraumbilical 4x4cm bulging asymmetry reduces down to a supraumbilical hernia. Mild laxity of the umbilical stalk but no true umbilical hernia there. Nontender, nondistended. No guarding.   Male Genitourinary Sexual Maturity Tanner 5 - Adult hair pattern and Adult penile size and shape.  Peripheral Vascular Upper Extremity Inspection - Left - No Cyanotic nailbeds, Not Ischemic. Right - No Cyanotic nailbeds, Not  Ischemic.  Neurologic Neurologic evaluation reveals -normal attention span and ability to concentrate, able to name objects and repeat phrases. Appropriate fund of knowledge , normal sensation and normal coordination. Mental Status Affect - not angry, not paranoid. Cranial Nerves-Normal Bilaterally. Gait-Normal.  Neuropsychiatric Mental status exam performed with findings of-able to articulate well with normal speech/language, rate, volume and coherence, thought content normal with ability to perform basic  computations and apply abstract reasoning and no evidence of hallucinations, delusions, obsessions or homicidal/suicidal ideation.  Musculoskeletal Global Assessment Spine, Ribs and Pelvis - no instability, subluxation or laxity. Right Upper Extremity - no instability, subluxation or laxity.  Lymphatic Head & Neck  General Head & Neck Lymphatics: Bilateral - Description - No Localized lymphadenopathy. Axillary  General Axillary Region: Bilateral - Description - No Localized lymphadenopathy. Femoral & Inguinal  Generalized Femoral & Inguinal Lymphatics: Left - Description - No Localized lymphadenopathy. Right - Description - No Localized lymphadenopathy.  BP (!) 152/105   Pulse 87   Temp 98.8 F (37.1 C) (Oral)   Resp 18   Wt 97.5 kg (215 lb)   SpO2 94%   BMI 29.99 kg/m    Assessment & Plan LEFT INGUINAL HERNIA (K40.90) Impression: Large left inguinal hernia, reducible. Mild impulse on right but no major hernia there.  I think he would benefit from repair. Offered laparoscopic exploration & repair of hernias found. I would have a low threshold to repair the contralateral side if there is any evidence of hernia there. He is interested in making sure that all potential hernias were repaired.   PREOP - ING HERNIA - ENCOUNTER FOR PREOPERATIVE EXAMINATION FOR GENERAL SURGICAL PROCEDURE (Z01.818) Current Plans You are being scheduled for surgery - Our schedulers will  call you.  You should hear from our office's scheduling department within 5 working days about the location, date, and time of surgery. We try to make accommodations for patient's preferences in scheduling surgery, but sometimes the OR schedule or the surgeon's schedule prevents [place] from making those accommodations.  If you have not heard from our office [phone]) in 5 working days, call the office and ask for your surgeon's nurse.  If you have other questions about your diagnosis, plan, or surgery, call the office and ask for your surgeon's nurse.  Written instructions provided The anatomy & physiology of the abdominal wall and pelvic floor was discussed. The pathophysiology of hernias in the inguinal and pelvic region was discussed. Natural history risks such as progressive enlargement, pain, incarceration, and strangulation was discussed. Contributors to complications such as smoking, obesity, diabetes, prior surgery, etc were discussed.  I feel the risks of no intervention will lead to serious problems that outweigh the operative risks; therefore, I recommended surgery to reduce and repair the hernia. I explained laparoscopic techniques with possible need for an open approach. I noted usual use of mesh to patch and/or buttress hernia repair  Risks such as bleeding, infection, abscess, need for further treatment, heart attack, death, and other risks were discussed. I noted a good likelihood this will help address the problem. Goals of post-operative recovery were discussed as well. Possibility that this will not correct all symptoms was explained. I stressed the importance of low-impact activity, aggressive pain control, avoiding constipation, & not pushing through pain to minimize risk of post-operative chronic pain or injury. Possibility of reherniation was discussed. We will work to minimize complications.  An educational handout further explaining the pathology & treatment options was given  as well. Questions were answered. The patient expresses understanding & wishes to proceed with surgery.  Pt Education - Pamphlet Given - Laparoscopic Hernia Repair: discussed with patient and provided information. Pt Education - [facility] Hernia Post-Op HCI (Meigan Pates): discussed with patient and provided information.  EPIGASTRIC HERNIA (K43.9) Impression: Small but definite supraumbilical hernia. Getting larger. Sensitive.  I think he would benefit from repair. Given his activity and job requirements, most likely  would do underlay mesh repair. Perhaps small enough just to do stitches.  PREOP - [facility] - ENCOUNTER FOR PREOPERATIVE EXAMINATION FOR GENERAL SURGICAL PROCEDURE (Z01.818) Current Plans You are being scheduled for surgery - Our schedulers will call you.  You should hear from our office's scheduling department within 5 working days about the location, date, and time of surgery. We try to make accommodations for patient's preferences in scheduling surgery, but sometimes the OR schedule or the surgeon's schedule prevents us from making those accommodations.  If you have not heard from our office [phone]) in 5 working days, call the office and ask for your surgeon's nurse.  If you have other questions about your diagnosis, plan, or surgery, call the office and ask for your surgeon's nurse.  Written instructions provided The anatomy & physiology of the abdominal wall was discussed. The pathophysiology of hernias was discussed. Natural history risks without surgery including progeressive enlargement, pain, incarceration, & strangulation was discussed. Contributors to complications such as smoking, obesity, diabetes, prior surgery, etc were discussed.  I feel the risks of no intervention will lead to serious problems that outweigh the operative risks; therefore, I recommended surgery to reduce and repair the hernia. I explained laparoscopic techniques with possible need for an open approach. I  noted the probable use of mesh to patch and/or buttress the hernia repair  Risks such as bleeding, infection, abscess, need for further treatment, heart attack, death, and other risks were discussed. I noted a good likelihood this will help address the problem. Goals of post-operative recovery were discussed as well. Possibility that this will not correct all symptoms was explained. I stressed the importance of low-impact activity, aggressive pain control, avoiding constipation, & not pushing through pain to minimize risk of post-operative chronic pain or injury. Possibility of reherniation especially with smoking, obesity, diabetes, immunosuppression, and other health conditions was discussed. We will work to minimize complications.  An educational handout further explaining the pathology & treatment options was given as well. Questions were answered. The patient expresses understanding & wishes to proceed with surgery.  TOBACCO ABUSE (Z72.0) Current Plans Pt Education - [facility] STOP SMOKING!  STOP SMOKING! We talked to the patient about the dangers of smoking.  We stressed that tobacco use dramatically increases the risk of peri-operative complications such as infection, tissue necrosis leaving to problems with incision/wound and organ healing, hernia, chronic pain, heart attack, stroke, DVT, pulmonary embolism, and death.  We noted there are programs in our community to help stop smoking.  Information was available.   Ardeth SportsmanSteven C. Zacherie Honeyman, M.D., F.A.C.S. Gastrointestinal and [facility]ally [facility] [address] [place], [place] [id number] [phone] Main / Paging

## 2016-04-21 NOTE — Discharge Instructions (Signed)
[facility] REPAIR: POST OP INSTRUCTIONS ° °###################################################################### ° °EAT °Gradually transition to a high fiber diet with a fiber supplement over the next few weeks after discharge.  Start with a pureed / full liquid diet (see below) ° °WALK °Walk an hour a day.  Control your pain to do that.   ° °CONTROL PAIN °Control pain so that you can walk, sleep, tolerate sneezing/coughing, go up/down stairs. ° °HAVE A BOWEL MOVEMENT DAILY °Keep your bowels regular to avoid problems.  OK to try a laxative to override constipation.  OK to use an antidairrheal to slow down diarrhea.  Call if not better after 2 tries ° °CALL IF YOU HAVE PROBLEMS/CONCERNS °Call if you are still struggling despite following these instructions. °Call if you have concerns not answered by these instructions ° °###################################################################### ° ° ° °1. DIET: Follow a light bland diet the first 24 hours after arrival home, such as soup, liquids, crackers, etc.  Be sure to include lots of fluids daily.  Avoid fast food or heavy meals as your are more likely to get nauseated.  Eat a low fat the next few days after surgery. °2. Take your usually prescribed home medications unless otherwise directed. °3. PAIN CONTROL: °a. Pain is best controlled by a usual combination of three different methods TOGETHER: °i. Ice/Heat °ii. Over the counter pain medication °iii. Prescription pain medication °b. Most patients will experience some swelling and bruising around the hernia(s) such as the bellybutton, groins, or old incisions.  Ice packs or heating pads (30-60 minutes up to 6 times a day) will help. Use ice for the first few days to help decrease swelling and bruising, then switch to heat to help relax tight/sore spots and speed recovery.  Some people prefer to use ice alone, heat alone, alternating between ice & heat.  Experiment to what works for you.  Swelling and bruising can take  several weeks to resolve.   °c. It is helpful to take an over-the-counter pain medication regularly for the first few weeks.  Choose one of the following that works best for you: °i. Naproxen (Aleve, etc)  Two 220mg tabs twice a day °ii. Ibuprofen (Advil, etc) Three 200mg tabs four times a day (every meal & bedtime) °iii. Acetaminophen (Tylenol, etc) 325-650mg four times a day (every meal & bedtime) °d. A  prescription for pain medication should be given to you upon discharge.  Take your pain medication as prescribed.  °i. If you are having problems/concerns with the prescription medicine (does not control pain, nausea, vomiting, rash, itching, etc), please call us [phone] to see if we need to switch you to a different pain medicine that will work better for you and/or control your side effect better. °ii. If you need a refill on your pain medication, please contact your pharmacy.  They will contact our office to request authorization. Prescriptions will not be filled after 5 pm or on week-ends. °4. Avoid getting constipated.  Between the surgery and the pain medications, it is common to experience some constipation.  Increasing fluid intake and taking a fiber supplement (such as Metamucil, Citrucel, FiberCon, MiraLax, etc) 1-2 times a day regularly will usually help prevent this problem from occurring.  A mild laxative (prune juice, Milk of Magnesia, MiraLax, etc) should be taken according to package directions if there are no bowel movements after 48 hours.   °5. Wash / shower every day.  You may shower over the dressings as they are waterproof.   °6. Remove   your waterproof bandages 5 days after surgery.  You may leave the incision open to air.  You may replace a dressing/Band-Aid to cover the incision for comfort if you wish.  Continue to shower over incision(s) after the dressing is off. ° ° ° °7. ACTIVITIES as tolerated:   °a. You may resume regular (light) daily activities beginning the next day--such  as daily self-care, walking, climbing stairs--gradually increasing activities as tolerated.  If you can walk 30 minutes without difficulty, it is safe to try more intense activity such as jogging, treadmill, bicycling, low-impact aerobics, swimming, etc. °b. Save the most intensive and strenuous activity for last such as sit-ups, heavy lifting, contact sports, etc  Refrain from any heavy lifting or straining until you are off narcotics for pain control.   °c. DO NOT PUSH THROUGH PAIN.  Let pain be your guide: If it hurts to do something, don't do it.  Pain is your body warning you to avoid that activity for another week until the pain goes down. °d. You may drive when you are no longer taking prescription pain medication, you can comfortably wear a seatbelt, and you can safely maneuver your car and apply brakes. °e. You may have sexual intercourse when it is comfortable.  °8. FOLLOW UP in our office °a. Please call [facility] at [phone] to set up an appointment to see your surgeon in the office for a follow-up appointment approximately 2-3 weeks after your surgery. °b. Make sure that you call for this appointment the day you arrive home to insure a convenient appointment time. °9.  IF YOU HAVE DISABILITY OR FAMILY LEAVE FORMS, BRING THEM TO THE OFFICE FOR PROCESSING.  DO NOT GIVE THEM TO YOUR DOCTOR. ° °WHEN TO CALL US [phone]: °1. Poor pain control °2. Reactions / problems with new medications (rash/itching, nausea, etc)  °3. Fever over 101.5 F (38.5 C) °4. Inability to urinate °5. Nausea and/or vomiting °6. Worsening swelling or bruising °7. Continued bleeding from incision. °8. Increased pain, redness, or drainage from the incision ° ° The clinic staff is available to answer your questions during regular business hours (8:30am-5pm).  Please don’t hesitate to call and ask to speak to one of our nurses for clinical concerns.  ° If you have a medical emergency, go to the nearest emergency room or call  911. ° A surgeon from [facility] is always on call at the hospitals in [place] ° °[facility] °[address], [address], [place], [place]  [address] ? ° [address] [address], [place], [place]   [address] °MAIN: [phone] ? TOLL FREE: [phone] ? FAX: [phone] °[email or web address] ° °STOP SMOKING! ° °We strongly recommend that you stop smoking.  Smoking increases the risk of surgery including infection in the form of an open wound, pus formation, abscess, hernia at an incision on the abdomen, etc.  You have an increased risk of other MAJOR complications such as stroke, heart attack, forming clots in the leg and/or lungs, and death.   ° °Smoking Cessation °Quitting smoking is important to your health and has many advantages. However, it is not always easy to quit since nicotine is a very addictive drug. Often times, people try 3 times or more before being able to quit. This document explains the best ways for you to prepare to quit smoking. Quitting takes hard work and a lot of effort, but you can do it. °ADVANTAGES OF QUITTING SMOKING °· You will live longer, feel   better, and live better. °· Your body will feel the impact of quitting smoking almost immediately. °· Within 20 minutes, blood pressure decreases. Your pulse returns to its normal level. °· After 8 hours, carbon monoxide levels in the blood return to normal. Your oxygen level increases. °· After 24 hours, the chance of having a heart attack starts to decrease. Your breath, hair, and body stop smelling like smoke. °· After 48 hours, damaged nerve endings begin to recover. Your sense of taste and smell improve. °· After 72 hours, the body is virtually free of nicotine. Your bronchial tubes relax and breathing becomes easier. °· After 2 to 12 weeks, lungs can hold more air. Exercise becomes easier and circulation improves. °· The risk of having a heart attack, stroke, cancer, or lung disease is greatly  reduced. °· After 1 year, the risk of coronary heart disease is cut in half. °· After 5 years, the risk of stroke falls to the same as a nonsmoker. °· After 10 years, the risk of lung cancer is cut in half and the risk of other cancers decreases significantly. °· After 15 years, the risk of coronary heart disease drops, usually to the level of a nonsmoker. °· If you are pregnant, quitting smoking will improve your chances of having a healthy baby. °· The people you live with, especially any children, will be healthier. °· You will have extra money to spend on things other than cigarettes. °QUESTIONS TO THINK ABOUT BEFORE ATTEMPTING TO QUIT °You may want to talk about your answers with your caregiver. °· Why do you want to quit? °· If you tried to quit in the past, what helped and what did not? °· What will be the most difficult situations for you after you quit? How will you plan to handle them? °· Who can help you through the tough times? Your family? Friends? A caregiver? °· What pleasures do you get from smoking? What ways can you still get pleasure if you quit? °Here are some questions to ask your caregiver: °· How can you help me to be successful at quitting? °· What medicine do you think would be best for me and how should I take it? °· What should I do if I need more help? °· What is smoking withdrawal like? How can I get information on withdrawal? °GET READY °· Set a quit date. °· Change your environment by getting rid of all cigarettes, ashtrays, matches, and lighters in your home, car, or work. Do not let people smoke in your home. °· Review your past attempts to quit. Think about what worked and what did not. °GET SUPPORT AND ENCOURAGEMENT °You have a better chance of being successful if you have help. You can get support in many ways. °· Tell your family, friends, and co-workers that you are going to quit and need their support. Ask them not to smoke around you. °· Get individual, group, or telephone  counseling and support. Programs are available at local hospitals and health centers. Call your local health department for information about programs in your area. °· Spiritual beliefs and practices may help some smokers quit. °· Download a "quit meter" on your computer to keep track of quit statistics, such as how long you have gone without smoking, cigarettes not smoked, and money saved. °· Get a self-help book about quitting smoking and staying off of tobacco. °LEARN NEW SKILLS AND BEHAVIORS °· Distract yourself from urges to smoke. Talk to someone, go for a walk,   or occupy your time with a task. °· Change your normal routine. Take a different route to work. Drink tea instead of coffee. Eat breakfast in a different place. °· Reduce your stress. Take a hot bath, exercise, or read a book. °· Plan something enjoyable to do every day. Reward yourself for not smoking. °· Explore interactive web-based programs that specialize in helping you quit. °GET MEDICINE AND USE IT CORRECTLY °Medicines can help you stop smoking and decrease the urge to smoke. Combining medicine with the above behavioral methods and support can greatly increase your chances of successfully quitting smoking. °· Nicotine replacement therapy helps deliver nicotine to your body without the negative effects and risks of smoking. Nicotine replacement therapy includes nicotine gum, lozenges, inhalers, nasal sprays, and skin patches. Some may be available over-the-counter and others require a prescription. °· Antidepressant medicine helps people abstain from smoking, but how this works is unknown. This medicine is available by prescription. °· Nicotinic receptor partial agonist medicine simulates the effect of nicotine in your brain. This medicine is available by prescription. °Ask your caregiver for advice about which medicines to use and how to use them based on your health history. Your caregiver will tell you what side effects to look out for if you  choose to be on a medicine or therapy. Carefully read the information on the package. Do not use any other product containing nicotine while using a nicotine replacement product.  °RELAPSE OR DIFFICULT SITUATIONS °Most relapses occur within the first 3 months after quitting. Do not be discouraged if you start smoking again. Remember, most people try several times before finally quitting. You may have symptoms of withdrawal because your body is used to nicotine. You may crave cigarettes, be irritable, feel very hungry, cough often, get headaches, or have difficulty concentrating. The withdrawal symptoms are only temporary. They are strongest when you first quit, but they will go away within 10 14 days. °To reduce the chances of relapse, try to: °· Avoid drinking alcohol. Drinking lowers your chances of successfully quitting. °· Reduce the amount of caffeine you consume. Once you quit smoking, the amount of caffeine in your body increases and can give you symptoms, such as a rapid heartbeat, sweating, and anxiety. °· Avoid smokers because they can make you want to smoke. °· Do not let weight gain distract you. Many smokers will gain weight when they quit, usually less than 10 pounds. Eat a healthy diet and stay active. You can always lose the weight gained after you quit. °· Find ways to improve your mood other than smoking. °FOR MORE INFORMATION  °www.smokefree.gov  ° ° °While it can be one of the most difficult things to do, the [facility] community has programs to help you stop.  Consider talking with your primary care physician about options.  Also, Smoking Cessation classes are available through the [facility]: ° °The smoking cessation program is a proven-effective program from the [employer]. The program is available for anyone 18 and older who currently smokes. The program lasts for 7 weeks and is 8 sessions. Each class will be approximately 1 1/2 hours. The program is every Tuesday.  All classes are  12-1:30pm and same location. ° °Event Location Information:  °Location: [facility] 2nd Floor Conference Room [id number]; located next to [facility] ° °Closest cross streets: [address] Entrance into the [facility] is adjacent to the [facility]'s main entrance. The conference   room is located on the 2nd floor.  °Parking Instructions: Visitor parking is adjacent to [facility]'s main entrance and the Cancer Center ° ° ° A smoking cessation program is also offered through the [facility]. Register online at [email or web address] or call [phone] for more information.  ° Tobacco cessation counseling is available at [facility]. Call [phone] for a free appointment.  ° Tobacco cessation classes also are available through the [facility] in [place]. For information, call [phone].  ° The Patient Education Network features videos on tobacco cessation. Please consult your listings in the center of this book to find instructions on how to access this resource.  ° If you want more information, ask your nurse. ° ° ° °  ° °

## 2016-04-21 NOTE — Anesthesia Procedure Notes (Signed)
Procedure Name: Intubation Date/Time: 04/21/2016 7:41 AM Performed by: Quentin OreWALKER, Salisa Broz E Pre-anesthesia Checklist: Patient identified, Emergency Drugs available, Suction available and Patient being monitored Patient Re-evaluated:Patient Re-evaluated prior to inductionOxygen Delivery Method: Circle system utilized Preoxygenation: Pre-oxygenation with 100% oxygen Intubation Type: IV induction Ventilation: Mask ventilation without difficulty Laryngoscope Size: Mac and 4 Grade View: Grade I Tube type: Oral Tube size: 7.5 mm Number of attempts: 1 Airway Equipment and Method: Stylet Placement Confirmation: ETT inserted through vocal cords under direct vision,  positive ETCO2 and breath sounds checked- equal and bilateral Secured at: 22 cm Tube secured with: Tape Dental Injury: Teeth and Oropharynx as per pre-operative assessment

## 2016-04-21 NOTE — Op Note (Signed)
04/21/2016  10:26 AM  PATIENT:  Tawny AsalJames W Darwin  60 y.o. male  Patient Care Team: Daisy Floroharles Alan Ross, MD as PCP - General (Family Medicine) Karie SodaSteven Bernon Arviso, MD as Consulting Physician (General Surgery)  PRE-OPERATIVE DIAGNOSIS:  Hernias in left and possible right groins  Hernia above bellybutton  POST-OPERATIVE DIAGNOSIS:    Bilateral inguinal hernias Left femoral hernia Incarcerated epigastric ventral wall hernia Umbilical hernia   PROCEDURE:   LAPAROSCOPIC BILATERAL INGUINAL HERNIA REPAIR WITH MESH LAPAROSCOPIC LEFT FEMORAL HERNIA REPAIR WITH MESH LAPAROSCOPIC REPAIR OF EPIGASTRIC & UMBILICAL VENTRAL WALL HERNIAS WITH MESH  SURGEON:  Surgeon(s): Karie SodaSteven Antwyne Pingree, MD  ASSISTANT: None  ANESTHESIA:     Regional ilioinguinal and genitofemoral and spermatic cord nerve blocks  General  EBL:  No intake/output data recorded.  Delay start of Pharmacological VTE agent (>24hrs) due to surgical blood loss or risk of bleeding:  no  DRAINS: NONE  SPECIMEN:  NONE  DISPOSITION OF SPECIMEN:  N/A  COUNTS:  YES  PLAN OF CARE: Discharge to home after PACU  PATIENT DISPOSITION:  PACU - hemodynamically stable.  INDICATION: Pleasant patient with symptomatic left renal hernia going down to scrotum.  Exam concerning for possible small renal hernia as well.  Also increased swelling supraumbilically suspicious for incarcerated epigastric hernia.  Mild laxity of bellybutton.  I recommended laparoscopic exploration and repair of hernias found  The anatomy & physiology of the abdominal wall and pelvic floor was discussed.  The pathophysiology of hernias in the inguinal and pelvic region was discussed.  Natural history risks such as progressive enlargement, pain, incarceration & strangulation was discussed.   Contributors to complications such as smoking, obesity, diabetes, prior surgery, etc were discussed.    I feel the risks of no intervention will lead to serious problems that outweigh the  operative risks; therefore, I recommended surgery to reduce and repair the hernia.  I explained laparoscopic techniques with possible need for an open approach.  I noted usual use of mesh to patch and/or buttress hernia repair  Risks such as bleeding, infection, abscess, need for further treatment, heart attack, death, and other risks were discussed.  I noted a good likelihood this will help address the problem.   Goals of post-operative recovery were discussed as well.  Possibility that this will not correct all symptoms was explained.  I stressed the importance of low-impact activity, aggressive pain control, avoiding constipation, & not pushing through pain to minimize risk of post-operative chronic pain or injury. Possibility of reherniation was discussed.  We will work to minimize complications.     An educational handout further explaining the pathology & treatment options was given as well.  Questions were answered.  The patient expresses understanding & wishes to proceed with surgery.  OR FINDINGS:   On the left pelvis, the patient had a large direct space inguinal hernia.  Also a femoral hernia.  No obturator or indirect inguinal hernias.  On the right side, patient had a small but definite direct space inguinal hernia.  No indirect, femoral, obturator hernias.  Patient had a 4 x 2 cm epigastric ventral hernia incarcerated with some omentum and prepare no fat and falciform ligament.  He had a 1 cm umbilical hernia through the stalk.    Type of repair - Laparoscopic underlay repair   Name of mesh - Bard Ventralight dual sided (polypropylene / Seprafilm)  Size of mesh - Height 20 cm, Width 15 cm  Orientation:  Vertical  Mesh overlap - 5-7 cm  Placement of mesh - Intraperitoneal underlay repair  DESCRIPTION:  The patient was identified & brought into the operating room. The patient was positioned supine with arms tucked. SCDs were active during the entire case. The patient underwent  general anesthesia without any difficulty.  The abdomen was prepped and draped in a sterile fashion. The patient's bladder was emptied.  A Surgical Timeout confirmed our plan.  I made a transverse incision through the inferior umbilical fold.  I made a small transverse nick through the anterior rectus fascia contralateral to the inguinal hernia side and placed a 0-vicryl stitch through the fascia.  I placed a Hasson trocar into the preperitoneal plane.  Entry was clean.  We induced carbon dioxide insufflation. Camera inspection revealed no injury.  I used a 10mm angled scope to bluntly free the peritoneum off the infraumbilical anterior abdominal wall.  I created enough of a preperitoneal pocket to place 5mm ports into the right & left mid-abdomen into this preperitoneal cavity.  I focused attention on the  LEFT hernia(s)   side since that was the dominant hernia side.   I used blunt & focused sharp dissection to free the peritoneum off the flank and down to the pubic rim.  I freed the anteriolateral bladder wall off the anteriolateral pelvic wall, sparing midline attachments.   I located a swath of peritoneum going into a hernia fascial defect at the  direct space consistent with  a direct space inguinal hernia..  I gradually freed the peritoneal hernia sac off safely and reduced it into the preperitoneal space.  I freed the peritoneum off the spermatic vessels & vas deferens.  I freed peritoneum off the retroperitoneum along the psoas muscle.  Was able to reduce a perineal fat out of an obvious femoral hernia as well.  No major lymphadenopathy noted.  No obturator hernia.Spermatic cord lipoma was dissected away & removed.  I checked & assured hemostasis.     I turned attention on the opposite side.  I did dissection in a similar, mirror-image fashion. The patient had a direct space inguinal hernia.Marland Kitchen.   Spermatic cord lipoma was dissected away & removed.    I checked & assured hemostasis.     I chose 15x15  cm sheets of ultra-lightweight polypropylene mesh (Ultrapro), one for each side.  I cut a single sigmoid-shaped slit ~6cm from a corner of each mesh.  I placed the meshes into the preperitoneal space & laid them as overlapping diamonds such that at the inferior points, a 6x6 cm corner flap rested in the true anterolateral pelvis, covering the obturator & femoral foramina.   I allowed the bladder to return to the pubis, this helping tuck the corners of the mesh in the anteriolateral pelvis.  The medial corners overlapped each other across midline cephalad to the pubic rim.    Because of the large direct space hernias, I did place a third sheet of mesh vertically along the midline preperitoneal with the inferior tip below the pubic tubercle near the trigone of the bladder.  Lateral wings overlapped across the direct space Wells for good reinforced coverage.  I held the hernia sacs cephalad & evacuated carbon dioxide.  I then focused on the ventral hernias.  The patient was positioned in reverse Trendelenburg. Abdominal entry was gained using optical entry technique in the left upper abdomen. Entry was clean. I induced carbon dioxide insufflation. Camera inspection revealed no injury. Extra ports were carefully placed under direct laparoscopic visualization.   I could  see the hernia in the supraumbilical and umbilical regions.   I did laparoscopic lysis of adhesions to expose the entire anterior abdominal wall.  I primarily focused scissors.  Freed greater omentum off.  I feel off the falciform ligament and preperitoneal fat off as well.  With that I could see the epigastric hernia.  Had a moderate amount of omentum and preperitoneal fat and falciform ligament incarcerated with it.  This was gradually reduced.  Redundant tissue removed.  Frame things off noticed an umbilical hernia as well.    I made sure hemostasis was good.  I mapped out the region using a needle passer.   To ensure that I would have at least 5  cm radial coverage outside of the hernia defect, I chose a 20x15cm dual sided mesh.  I placed #1 Prolene stitches around its edge about every 5 cm = 12 total.  I rolled the mesh & placed into the peritoneal cavity through the reduced epigastric ventral hernia.  I unrolled  the mesh and positioned it appropriately.  I secured the mesh to cover up the hernia defect using a laparoscopic suture passer to pass the tails of the Prolene through the abdominal wall & tagged them with clamps.  I started out in four corners to make sure I had the mesh centered under the hernia defect appropriately, and then proceeded to work in quadrants.  We evacuated CO2 & desufflated the abdomen.  I tied the fascial stitches down. I closed the  epigastric ventral wall hernia using #1 PDS interrupted transverse stitches primarily.   I reinsufflated the abdomen.  The mesh provided at least 5-10 cm circumferential coverage around the entire region of hernia defects.   I tacked the edges & central part of the mesh to the peritoneum/posterior rectus fascia with SecureStrap absorbable tacks.    I used a #1 PDS through the epigastric hernia repair in and out the mesh to help centrally tacked the mesh.  Did it at the level of the umbilical hernia as well.    A good field block of local anesthesia was used at fascial stitch sites & fascial closure areas.  I did reinspection. Hemostasis was good. Mesh laid well. Capnoperitoneum was evacuated. Ports were removed. The skin was closed with Monocryl at the port sites and Steri-Strips on the fascial stitch puncture sites.    Patient is being extubated to go to the recovery room. I discussed operative findings, updated the patient's status, discussed probable steps to recovery, and gave postoperative recommendations to the patient's spouse.  Recommendations were made.  Questions were answered.  She expressed understanding & appreciation.  Ardeth Sportsman, M.D., F.A.C.S. Gastrointestinal and  Minimally Invasive Surgery [facility] [address] [place], [place] [address] [phone] Main / Paging

## 2016-04-21 NOTE — Anesthesia Postprocedure Evaluation (Signed)
Anesthesia Post Note  Patient: Tawny AsalJames W Mink  Procedure(s) Performed: Procedure(s) (LRB): LAPAROSCOPIC LEFT INGUINAL HERNIA POSSIBLE RIGHT INGUNIAL HERNIA WITH SUPRAUMBILICAL VENTRAL WALL HERNIA (Bilateral) INSERTION OF MESH (Bilateral)  Patient location during evaluation: PACU Anesthesia Type: General Level of consciousness: awake and alert and patient cooperative Pain management: pain level controlled Vital Signs Assessment: post-procedure vital signs reviewed and stable Respiratory status: spontaneous breathing and respiratory function stable Cardiovascular status: stable Anesthetic complications: no    Last Vitals:  Vitals:   04/21/16 1115 04/21/16 1119  BP:  118/87  Pulse: 79 69  Resp: 14 10  Temp:      Last Pain:  Vitals:   04/21/16 1100  TempSrc:   PainSc: 5                  Danaija Eskridge S

## 2016-04-21 NOTE — Anesthesia Preprocedure Evaluation (Addendum)
Anesthesia Evaluation  Patient identified by MRN, date of birth, ID band Patient awake    Reviewed: Allergy & Precautions, H&P , NPO status , Patient's Chart, lab work & pertinent test results  Airway Mallampati: I   Neck ROM: full    Dental  (+) Partial Upper   Pulmonary asthma , Current Smoker,    breath sounds clear to auscultation       Cardiovascular hypertension,  Rhythm:regular Rate:Normal     Neuro/Psych    GI/Hepatic   Endo/Other    Renal/GU      Musculoskeletal   Abdominal   Peds  Hematology   Anesthesia Other Findings   Reproductive/Obstetrics                            Anesthesia Physical Anesthesia Plan  ASA: II  Anesthesia Plan: General   Post-op Pain Management:    Induction: Intravenous  Airway Management Planned: Oral ETT  Additional Equipment:   Intra-op Plan:   Post-operative Plan: Extubation in OR  Informed Consent: I have reviewed the patients History and Physical, chart, labs and discussed the procedure including the risks, benefits and alternatives for the proposed anesthesia with the patient or authorized representative who has indicated his/her understanding and acceptance.     Plan Discussed with: CRNA, Anesthesiologist and Surgeon  Anesthesia Plan Comments:         Anesthesia Quick Evaluation

## 2016-04-22 ENCOUNTER — Encounter (HOSPITAL_COMMUNITY): Payer: Self-pay | Admitting: Surgery

## 2016-08-30 ENCOUNTER — Telehealth: Payer: Self-pay | Admitting: *Deleted

## 2016-08-30 NOTE — Telephone Encounter (Signed)
NOTES SENT TO SCHEDULING.  °

## 2016-10-17 ENCOUNTER — Encounter (INDEPENDENT_AMBULATORY_CARE_PROVIDER_SITE_OTHER): Payer: Self-pay

## 2016-10-17 ENCOUNTER — Ambulatory Visit (INDEPENDENT_AMBULATORY_CARE_PROVIDER_SITE_OTHER): Payer: Managed Care, Other (non HMO) | Admitting: Cardiology

## 2016-10-17 ENCOUNTER — Encounter: Payer: Self-pay | Admitting: Cardiology

## 2016-10-17 VITALS — BP 142/94 | HR 96 | Ht 71.0 in | Wt 212.2 lb

## 2016-10-17 DIAGNOSIS — R0609 Other forms of dyspnea: Secondary | ICD-10-CM

## 2016-10-17 DIAGNOSIS — I700 Atherosclerosis of aorta: Secondary | ICD-10-CM

## 2016-10-17 DIAGNOSIS — Z8249 Family history of ischemic heart disease and other diseases of the circulatory system: Secondary | ICD-10-CM

## 2016-10-17 DIAGNOSIS — I10 Essential (primary) hypertension: Secondary | ICD-10-CM | POA: Diagnosis not present

## 2016-10-17 NOTE — Patient Instructions (Signed)
Medication Instructions:  The current medical regimen is effective;  continue present plan and medications.  Testing/Procedures: Your physician has requested that you have an echocardiogram. Echocardiography is a painless test that uses sound waves to create images of your heart. It provides your doctor with information about the size and shape of your heart and how well your heart's chambers and valves are working. This procedure takes approximately one hour. There are no restrictions for this procedure.  Your physician has requested that you have an exercise tolerance test. For further information please visit [email or web address]. Please also follow instruction sheet, as given.  Follow-Up: Further follow up will be based on the results of the above testing.  If you need a refill on your cardiac medications before your next appointment, please call your pharmacy.  Thank you for choosing [facility]!!

## 2016-10-17 NOTE — Progress Notes (Signed)
Cardiology Office Note:    Date:  10/17/2016   ID:  Joshua Craig, DOB 1955-09-12, MRN [id number]  PCP:  Daisy Floro, MD  Cardiologist:  Donato Schultz, MD    Referring MD: Daisy Floro, MD     History of Present Illness:    Joshua Craig is a 61 y.o. male here for evaluation of congestive heart failure at the request of Dr. Tenny Craw.  His mother died of heart failure and he is concerned. He is [place] smoker, trying to quit previously but his wife continues to smoke and it is hard for him. He is cutting back. 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  LDL cholesterols 118 triglycerides 230 glucose 99 creatinine 0.89  He is a past medical history of asthma, COPD, hypertension, hernia repair.  He does take an aspirin for prevention. Takes lisinopril. Has Advair for his asthma/COPD. Lisinopril.  Wants to retire soon, stressful.   Had a friend with stent, had back pain. This worries him because he also has back pain occasionally.  Surgery in 12/17. Hernia repair.  Moderate wheeze, SOB with activity. Wants to quit smoking. Denies any significant chest pressure or chest tightness, no orthopnea, no PND, no fevers, no bleeding.  Past Medical History:  Diagnosis Date  . Asthma   . Bilateral inguinal hernia s/p lap repair w mesh 04/21/2016 04/21/2016  . Hypertension   . Inguinal hernia   . Lactose intolerance   . Obesity 04/21/2016  . Umbilical hernia   . Ventral epigastric & umbilical hernias s/p lap repair with mesh 04/21/2016 04/21/2016    Past Surgical History:  Procedure Laterality Date  . INSERTION OF MESH Bilateral 04/21/2016   Procedure: INSERTION OF MESH;  Surgeon: Karie Soda, MD;  Location[facility] OR;  Service: General;  Laterality: Bilateral;  . LAPAROSCOPIC INGUINAL HERNIA WITH UMBILICAL HERNIA Bilateral 04/21/2016   Procedure: LAPAROSCOPIC LEFT INGUINAL HERNIA POSSIBLE RIGHT INGUNIAL HERNIA WITH SUPRAUMBILICAL VENTRAL WALL HERNIA;  Surgeon: Karie Soda, MD;  Location: [facility] OR;   Service: General;  Laterality: Bilateral;    Current Medications: Current Meds  Medication Sig  . ADVAIR DISKUS 250-50 MCG/DOSE AEPB Inhale 1 puff into the lungs 2 (two) times daily.  Marland Kitchen albuterol (PROVENTIL HFA;VENTOLIN HFA) 108 (90 Base) MCG/ACT inhaler Inhale 1-2 puffs into the lungs every 6 (six) hours as needed for wheezing or shortness of breath.  Marland Kitchen aspirin EC 81 MG tablet Take 81 mg by mouth daily.  . Garlic 1000 MG CAPS Take 1,000 mg by mouth daily.  Marland Kitchen ibuprofen (ADVIL,MOTRIN) 200 MG tablet Take 400 mg by mouth every 6 (six) hours as needed (for pain/headache.).  Marland Kitchen lisinopril (PRINIVIL,ZESTRIL) 20 MG tablet Take 20 mg by mouth daily.  . Multiple Vitamin (MULTIVITAMIN WITH MINERALS) TABS tablet Take 1 tablet by mouth daily.     Allergies:   Lactose intolerance (gi) and Vibramycin [doxycycline]   Social History   Social History  . Marital status: Married    Spouse name: N/A  . Number of children: N/A  . Years of education: N/A   Social History Main Topics  . Smoking status: Current Some Day Smoker  . Smokeless tobacco: Never Used  . Alcohol use Yes     Comment: occasionally  . Drug use: No  . Sexual activity: Not Asked   Other Topics Concern  . None   Social History Narrative  . None     Family History: The patient's family history includes Arthritis in his mother; Cancer -  Lung in his brother; Heart failure in his mother. ROS:   Please see the history of present illness.     All other systems reviewed and are negative.  EKGs/Labs/Other Studies Reviewed:    The following studies were reviewed today: Prior office notes, lab work, EKG reviewed.  EKG:  EKG is not ordered today.  Prior ECG demonstrates sinus rhythm with 2 PVCs, no other abnormalities.  Recent Labs: 04/12/2016: BUN 10; Creatinine, Ser 0.95; Hemoglobin 16.7; Platelets 217; Potassium 4.8; Sodium 139   Recent Lipid Panel No results found for: CHOL, TRIG, HDL, CHOLHDL, VLDL, LDLCALC,  LDLDIRECT  Physical Exam:    VS:  BP (!) 142/94   Pulse 96   Ht 5\' 11"  (1.803 m)   Wt 212 lb 3.2 oz (96.3 kg)   BMI 29.60 kg/m     Wt Readings from Last 3 Encounters:  10/17/16 212 lb 3.2 oz (96.3 kg)  04/21/16 215 lb (97.5 kg)  04/12/16 215 lb 4.8 oz (97.7 kg)     GEN:  Well nourished, well developed in no acute distress HEENT: Normal NECK: No JVD; No carotid bruits LYMPHATICS: No lymphadenopathy CARDIAC: RRR, no murmurs, rubs, gallops RESPIRATORY:  Clear to auscultation without rales, wheezing or rhonchi  ABDOMEN: Soft, non-tender, non-distended MUSCULOSKELETAL:  No edema; No deformity  SKIN: Warm and dry, palmar blood blister noted after using reciprocating saw. NEUROLOGIC:  Alert and oriented x 3 PSYCHIATRIC:  Normal affect   ASSESSMENT:    1. Dyspnea on exertion   2. Family history of CHF (congestive heart failure)   3. Aortic atherosclerosis (HCC)   4. Essential hypertension    PLAN:    In order of problems listed above:  Family history of heart failure  - Mother was diagnosed  - He's had several friends with coronary artery disease. He is eager to retire soon. 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  - We will order an exercise treadmill test to ensure that there is no evidence of ischemia.  Aortic atherosclerosis  - Noted on prior CT scan of chest. Could consider statin therapy in the future but I would advocate diet, exercise and smoking cessation first. Lipids are not  extremely abnormal.  Dyspnea on exertion  - Likely secondary to his years of smoking, but could be anginal equivalent. We will check exercise stress test and echocardiogram.   Tobacco use  - Both he and his wife smoke. He is to have crazy dreams on Chant[place]. His  anniversary is June 23. I encouraged him to try to stop smoking on a date that means a lot to both he and his wife. If they could do together that would be wonderful.  We will let him know results of  studies.   Medication Adjustments/Labs and Tests Ordered: Current medicines are reviewed at length with the patient today.  Concerns regarding medicines are outlined above. Labs and tests ordered and medication changes are outlined in the patient instructions below:  Patient Instructions  Medication Instructions:  The current medical regimen is effective;  continue present plan and medications.  Testing/Procedures: Your physician has requested that you have an echocardiogram. Echocardiography is a painless test that uses sound waves to create images of your heart. It provides your doctor with information about the size and shape of your heart and how well your heart's chambers and valves are working. This procedure takes approximately one hour. There are no restrictions for this procedure.  Your physician has requested that  you have an exercise tolerance test. For further information please visit [email or web address] Please also follow instruction sheet, as given.  Follow-Up: Further follow up will be based on the results of the above testing.  If you need a refill on your cardiac medications before your next appointment, please call your pharmacy.  Thank you for choosing [facility]!!        Signed, Donato Schultz, MD  10/17/2016 10:37 AM    [facility]

## 2016-11-28 ENCOUNTER — Other Ambulatory Visit: Payer: Self-pay

## 2016-11-28 ENCOUNTER — Ambulatory Visit (INDEPENDENT_AMBULATORY_CARE_PROVIDER_SITE_OTHER): Payer: Managed Care, Other (non HMO)

## 2016-11-28 ENCOUNTER — Ambulatory Visit (HOSPITAL_COMMUNITY): Payer: Managed Care, Other (non HMO) | Attending: Cardiovascular Disease

## 2016-11-28 ENCOUNTER — Encounter (INDEPENDENT_AMBULATORY_CARE_PROVIDER_SITE_OTHER): Payer: Self-pay

## 2016-11-28 DIAGNOSIS — R0609 Other forms of dyspnea: Secondary | ICD-10-CM | POA: Diagnosis not present

## 2016-11-28 DIAGNOSIS — I700 Atherosclerosis of aorta: Secondary | ICD-10-CM

## 2016-11-28 DIAGNOSIS — Z8249 Family history of ischemic heart disease and other diseases of the circulatory system: Secondary | ICD-10-CM

## 2016-11-28 LAB — EXERCISE TOLERANCE TEST
CSEPED: 10 min
CSEPEW: 12.5 METS
CSEPPHR: 142 {beats}/min
Exercise duration (sec): 31 s
MPHR: 158 {beats}/min
Percent HR: 90 %
RPE: 17
Rest HR: 76 {beats}/min

## 2018-06-16 IMAGING — CR DG CHEST 2V
2 series · 2 of 2 positions shown · non-contrast
Comparison: Chest CT 02/01/2016 and radiographs 01/17/2016

CLINICAL DATA: Cough. Recent bronchitis. History of smoking. Preop
for hernia repair surgery.

EXAM:
CHEST  2 VIEW

[w chest pa]
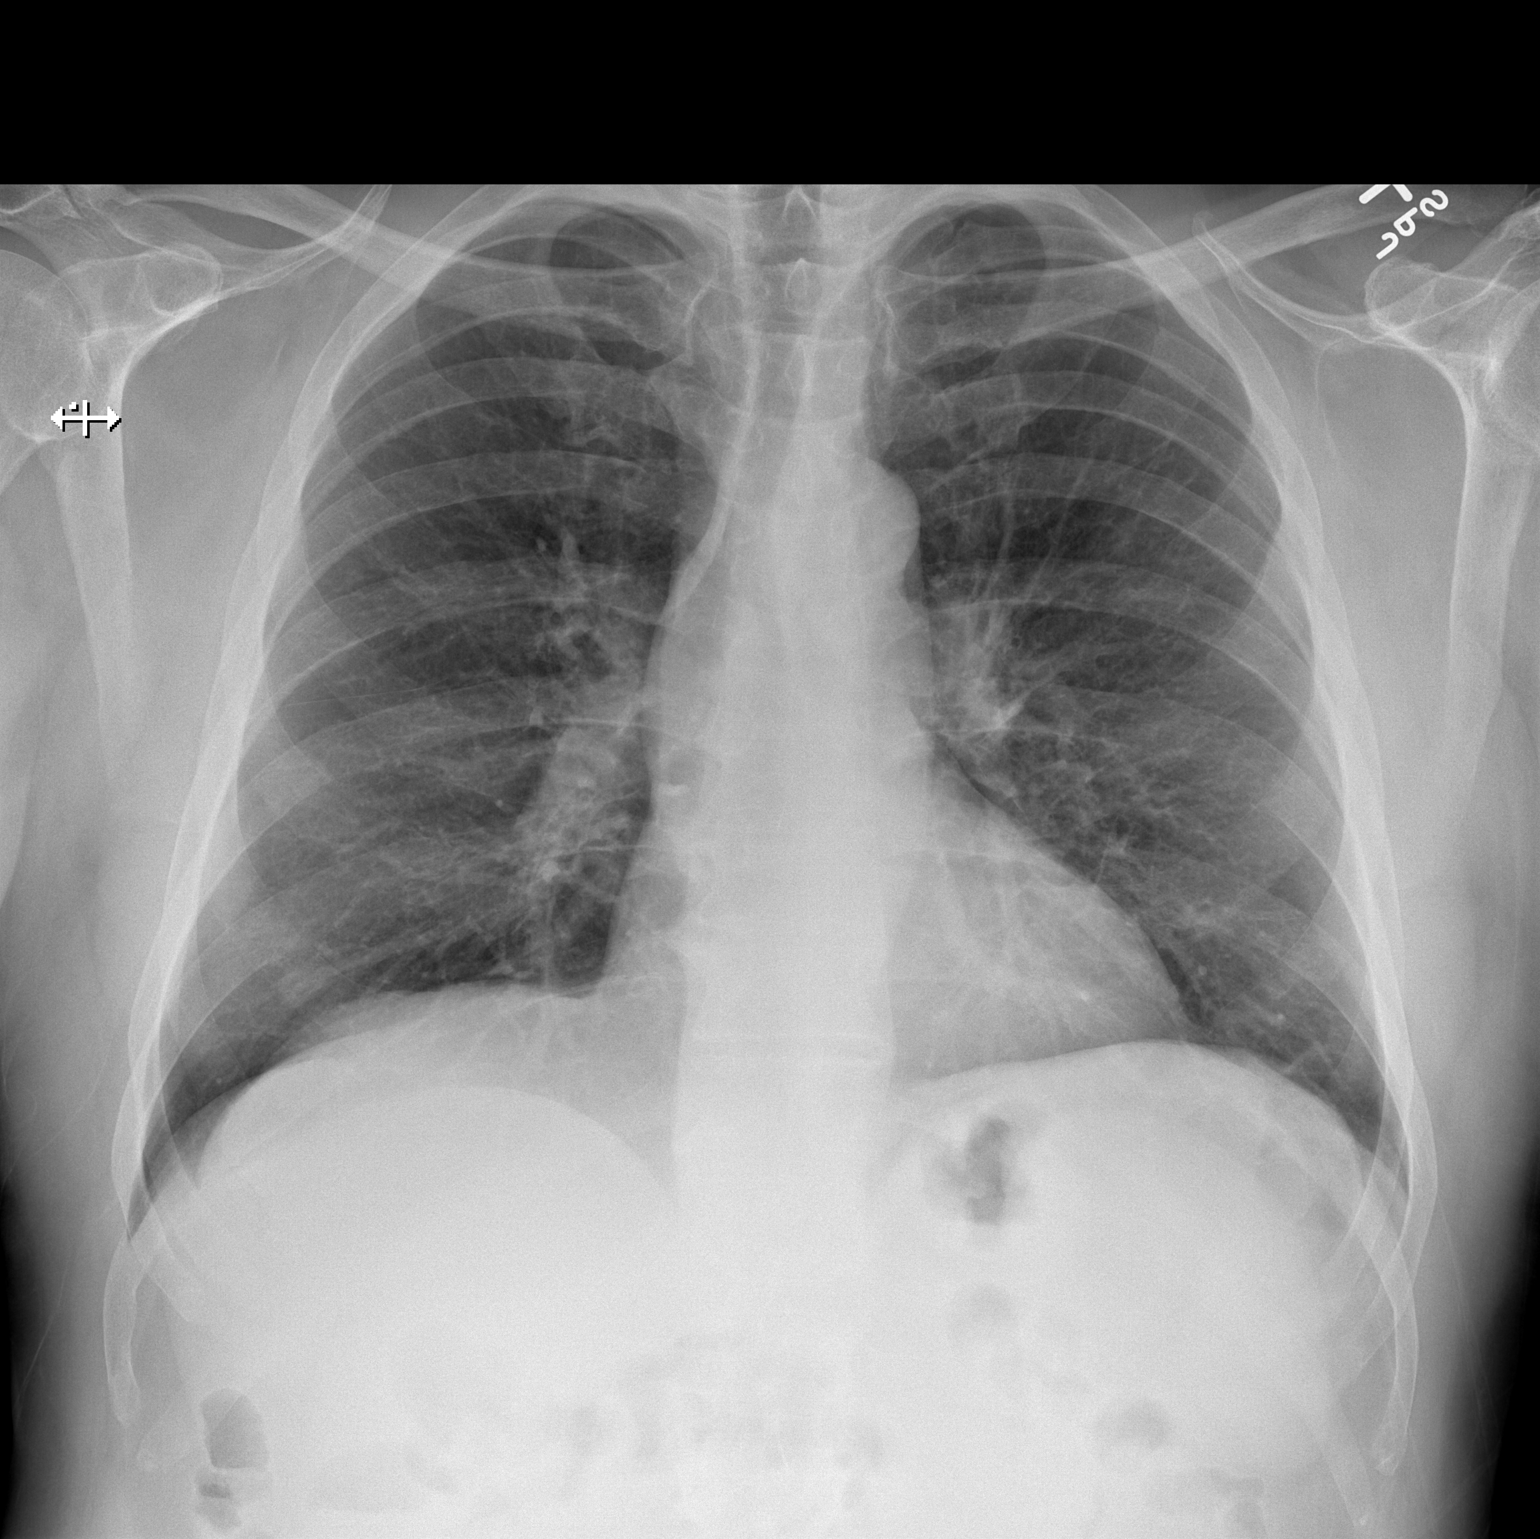

[w chest lat]
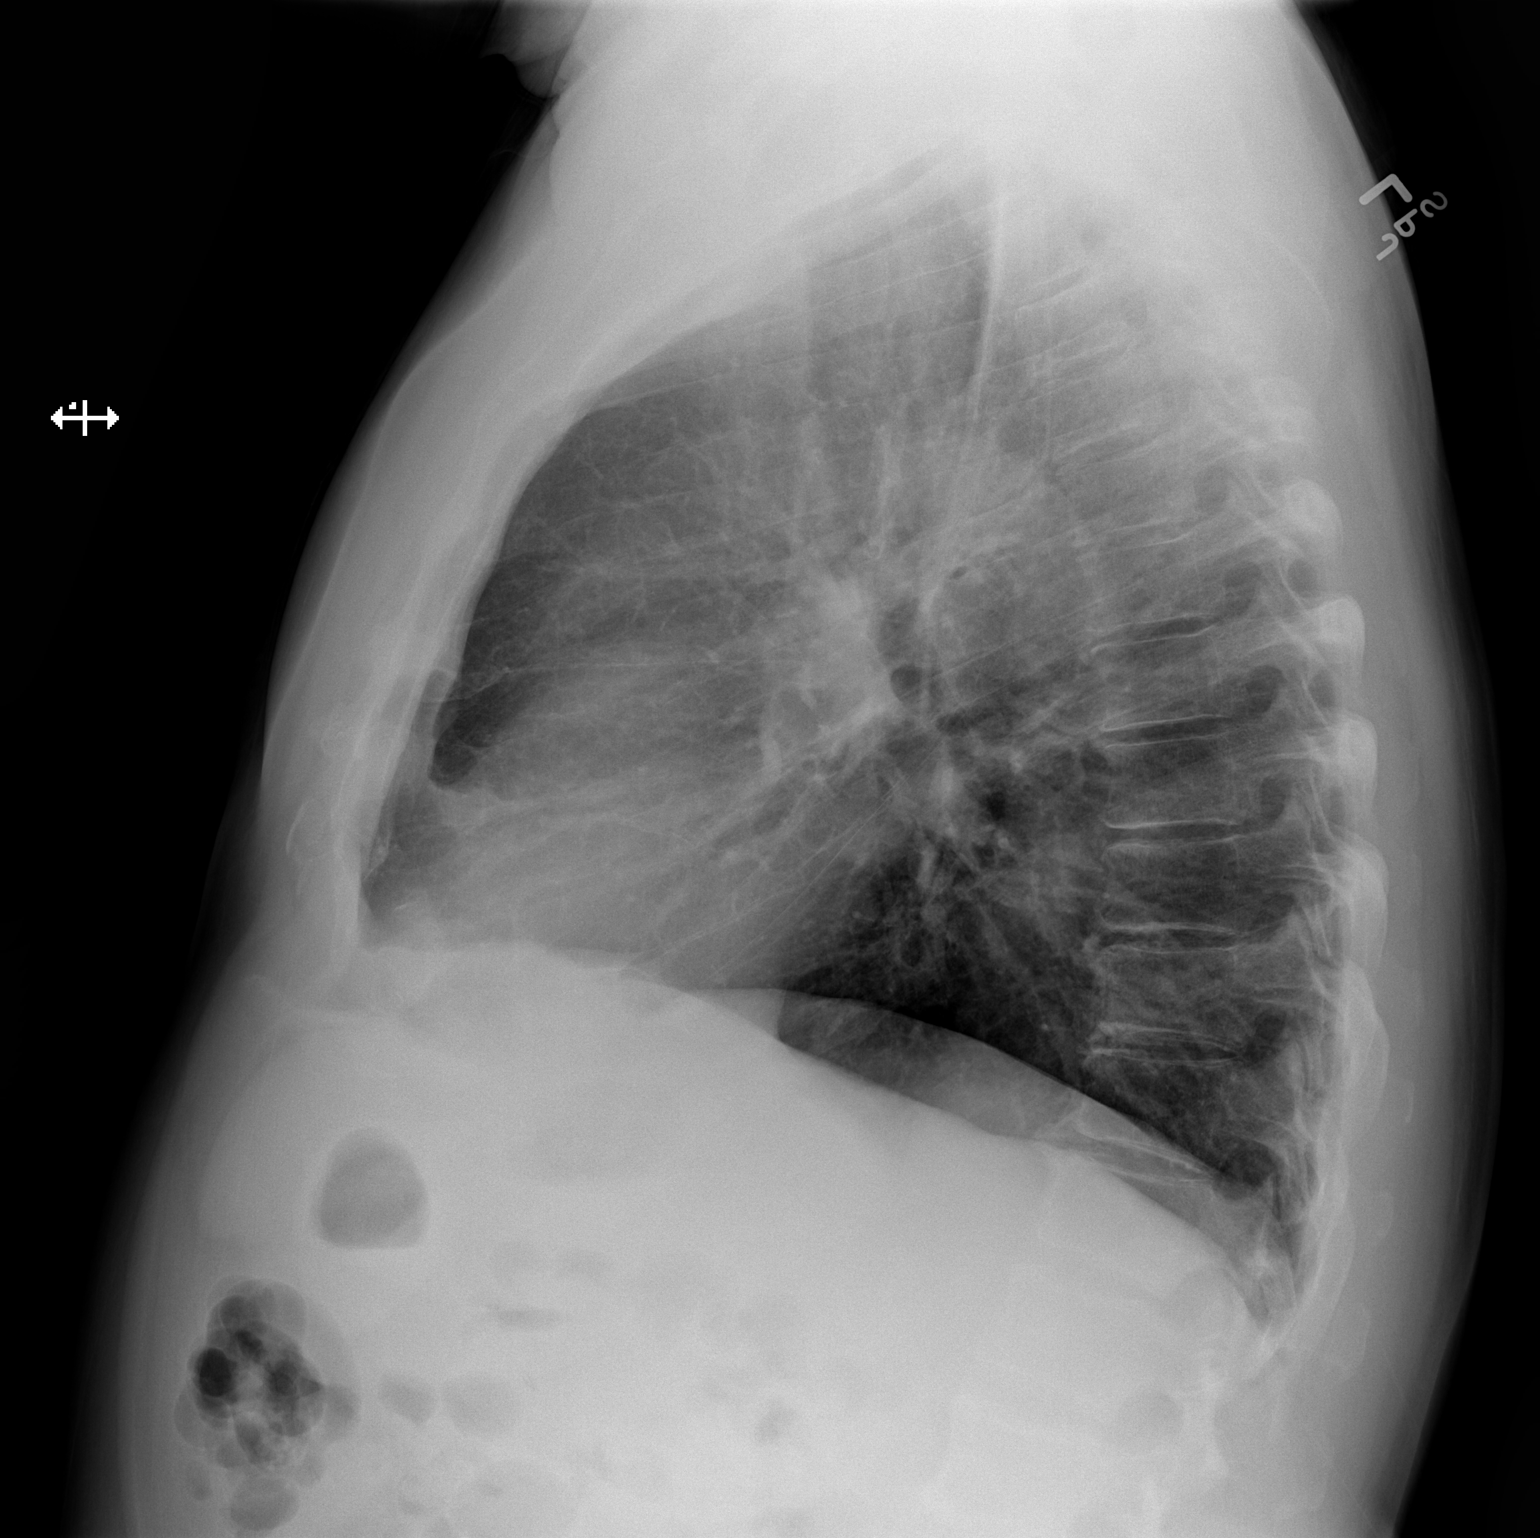

[2 of 2 positions shown; findings below may reference images not displayed]

FINDINGS: The cardiomediastinal silhouette is within normal limits. Mild
bronchitic changes are similar to the prior radiographs. No airspace
consolidation, edema, pleural effusion, or pneumothorax is
identified. No acute osseous abnormality is seen.
IMPRESSION: No evidence of active cardiopulmonary disease.

## 2020-05-20 DIAGNOSIS — M542 Cervicalgia: Secondary | ICD-10-CM | POA: Diagnosis not present

## 2020-11-19 DIAGNOSIS — J449 Chronic obstructive pulmonary disease, unspecified: Secondary | ICD-10-CM | POA: Diagnosis not present

## 2020-11-19 DIAGNOSIS — Z13220 Encounter for screening for lipoid disorders: Secondary | ICD-10-CM | POA: Diagnosis not present

## 2020-11-19 DIAGNOSIS — F17200 Nicotine dependence, unspecified, uncomplicated: Secondary | ICD-10-CM | POA: Diagnosis not present

## 2020-11-19 DIAGNOSIS — Z23 Encounter for immunization: Secondary | ICD-10-CM | POA: Diagnosis not present

## 2020-11-19 DIAGNOSIS — Z0000 Encounter for general adult medical examination without abnormal findings: Secondary | ICD-10-CM | POA: Diagnosis not present

## 2020-11-19 DIAGNOSIS — I10 Essential (primary) hypertension: Secondary | ICD-10-CM | POA: Diagnosis not present

## 2020-11-25 DIAGNOSIS — Z0000 Encounter for general adult medical examination without abnormal findings: Secondary | ICD-10-CM | POA: Diagnosis not present

## 2020-11-25 DIAGNOSIS — J4540 Moderate persistent asthma, uncomplicated: Secondary | ICD-10-CM | POA: Diagnosis not present

## 2020-11-25 DIAGNOSIS — R978 Other abnormal tumor markers: Secondary | ICD-10-CM | POA: Diagnosis not present

## 2020-11-25 DIAGNOSIS — J449 Chronic obstructive pulmonary disease, unspecified: Secondary | ICD-10-CM | POA: Diagnosis not present

## 2020-11-25 DIAGNOSIS — F17200 Nicotine dependence, unspecified, uncomplicated: Secondary | ICD-10-CM | POA: Diagnosis not present

## 2020-11-25 DIAGNOSIS — I10 Essential (primary) hypertension: Secondary | ICD-10-CM | POA: Diagnosis not present

## 2021-03-01 DIAGNOSIS — R978 Other abnormal tumor markers: Secondary | ICD-10-CM | POA: Diagnosis not present

## 2021-12-06 DIAGNOSIS — Z0000 Encounter for general adult medical examination without abnormal findings: Secondary | ICD-10-CM | POA: Diagnosis not present

## 2021-12-06 DIAGNOSIS — I10 Essential (primary) hypertension: Secondary | ICD-10-CM | POA: Diagnosis not present

## 2021-12-06 DIAGNOSIS — J449 Chronic obstructive pulmonary disease, unspecified: Secondary | ICD-10-CM | POA: Diagnosis not present

## 2022-12-07 DIAGNOSIS — I10 Essential (primary) hypertension: Secondary | ICD-10-CM | POA: Diagnosis not present

## 2022-12-14 DIAGNOSIS — I10 Essential (primary) hypertension: Secondary | ICD-10-CM | POA: Diagnosis not present

## 2022-12-14 DIAGNOSIS — J449 Chronic obstructive pulmonary disease, unspecified: Secondary | ICD-10-CM | POA: Diagnosis not present

## 2022-12-14 DIAGNOSIS — Z0000 Encounter for general adult medical examination without abnormal findings: Secondary | ICD-10-CM | POA: Diagnosis not present

## 2022-12-14 DIAGNOSIS — E7800 Pure hypercholesterolemia, unspecified: Secondary | ICD-10-CM | POA: Diagnosis not present

## 2022-12-14 DIAGNOSIS — R978 Other abnormal tumor markers: Secondary | ICD-10-CM | POA: Diagnosis not present

## 2023-02-01 ENCOUNTER — Encounter: Payer: Self-pay | Admitting: Cardiology

## 2023-06-19 ENCOUNTER — Other Ambulatory Visit: Payer: Self-pay

## 2023-06-19 ENCOUNTER — Emergency Department (HOSPITAL_COMMUNITY)
Admission: EM | Admit: 2023-06-19 | Discharge: 2023-06-19 | Disposition: A | Discharge status: Home / Self Care | Payer: PPO | Attending: Emergency Medicine | Admitting: Emergency Medicine

## 2023-06-19 DIAGNOSIS — J449 Chronic obstructive pulmonary disease, unspecified: Secondary | ICD-10-CM | POA: Diagnosis not present

## 2023-06-19 DIAGNOSIS — I10 Essential (primary) hypertension: Secondary | ICD-10-CM | POA: Insufficient documentation

## 2023-06-19 DIAGNOSIS — Z79899 Other long term (current) drug therapy: Secondary | ICD-10-CM | POA: Diagnosis not present

## 2023-06-19 DIAGNOSIS — Z7982 Long term (current) use of aspirin: Secondary | ICD-10-CM | POA: Diagnosis not present

## 2023-06-19 LAB — URINALYSIS, ROUTINE W REFLEX MICROSCOPIC
Bilirubin Urine: NEGATIVE
Glucose, UA: NEGATIVE mg/dL
Hgb urine dipstick: NEGATIVE
Ketones, ur: NEGATIVE mg/dL
Leukocytes,Ua: NEGATIVE
Nitrite: NEGATIVE
Protein, ur: NEGATIVE mg/dL
Specific Gravity, Urine: 1.004 — ABNORMAL LOW (ref 1.005–1.030)
pH: 6 (ref 5.0–8.0)

## 2023-06-19 LAB — CBC WITH DIFFERENTIAL/PLATELET
Abs Immature Granulocytes: 0.02 10*3/uL (ref 0.00–0.07)
Basophils Absolute: 0.1 10*3/uL (ref 0.0–0.1)
Basophils Relative: 1 %
Eosinophils Absolute: 0 10*3/uL (ref 0.0–0.5)
Eosinophils Relative: 1 %
HCT: 47.9 % (ref 39.0–52.0)
Hemoglobin: 16.5 g/dL (ref 13.0–17.0)
Immature Granulocytes: 0 %
Lymphocytes Relative: 33 %
Lymphs Abs: 1.8 10*3/uL (ref 0.7–4.0)
MCH: 31.6 pg (ref 26.0–34.0)
MCHC: 34.4 g/dL (ref 30.0–36.0)
MCV: 91.8 fL (ref 80.0–100.0)
Monocytes Absolute: 0.5 10*3/uL (ref 0.1–1.0)
Monocytes Relative: 9 %
Neutro Abs: 3.1 10*3/uL (ref 1.7–7.7)
Neutrophils Relative %: 56 %
Platelets: 223 10*3/uL (ref 150–400)
RBC: 5.22 MIL/uL (ref 4.22–5.81)
RDW: 12.8 % (ref 11.5–15.5)
WBC: 5.6 10*3/uL (ref 4.0–10.5)
nRBC: 0 % (ref 0.0–0.2)

## 2023-06-19 LAB — BASIC METABOLIC PANEL
Anion gap: 14 (ref 5–15)
BUN: 11 mg/dL (ref 8–23)
CO2: 25 mmol/L (ref 22–32)
Calcium: 9.1 mg/dL (ref 8.9–10.3)
Chloride: 103 mmol/L (ref 98–111)
Creatinine, Ser: 0.99 mg/dL (ref 0.61–1.24)
GFR, Estimated: >60 mL/min (ref >60)
Glucose, Bld: 112 mg/dL — ABNORMAL HIGH (ref 70–99)
Potassium: 3.8 mmol/L (ref 3.5–5.1)
Sodium: 142 mmol/L (ref 135–145)

## 2023-06-19 MED ORDER — AMLODIPINE BESYLATE 5 MG PO TABS: 5.0000 mg | ORAL_TABLET | Freq: Every day | ORAL | 2 refills | Status: AC

## 2023-06-19 MED ORDER — AMLODIPINE BESYLATE 5 MG PO TABS
5.0000 mg | ORAL_TABLET | Freq: Once | ORAL | Status: AC
  Administered 2023-06-19: 5 mg via ORAL
  Filled 2023-06-19: qty 1

## 2023-06-19 MED ORDER — AMLODIPINE BESYLATE 5 MG PO TABS: 5.0000 mg | ORAL_TABLET | Freq: Every day | ORAL | 2 refills | Status: DC

## 2023-06-19 NOTE — ED Provider Notes (Signed)
[facility] AT [facility] Provider Note   CSN: [id number] Arrival date & time: 06/19/23  9147     History  Chief Complaint  Patient presents with   Hypertension    Joshua Craig is a 68 y.o. male.  68 year old male with a history of asthma/COPD and hypertension on valsartan 320 mg QD who presents to the emergency department with elevated blood pressure.  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  Noted that his blood pressure was in the 160s.  Says that he has gained approximately 35 pounds recently.  Says that after he noted his blood pressure was elevated he decided to stop drinking alcohol a week ago.  Says his blood pressure has stayed elevated so decided to come into the emergency department today.  Currently is asymptomatic.  No recent headache, difficulty breathing, or chest pains.       Home Medications Prior to Admission medications   Medication Sig Start Date End Date Taking? Authorizing Provider  valsartan (DIOVAN) 320 MG tablet Take 320 mg by mouth daily.   Yes [provider]  ADVAIR DISKUS 250-50 MCG/DOSE AEPB Inhale 1 puff into the lungs 2 (two) times daily. 02/26/16   [provider]  albuterol (PROVENTIL HFA;VENTOLIN HFA) 108 (90 Base) MCG/ACT inhaler Inhale 1-2 puffs into the lungs every 6 (six) hours as needed for wheezing or shortness of breath.    [provider]  aspirin EC 81 MG tablet Take 81 mg by mouth daily.    [provider]  Garlic 1000 MG CAPS Take 1,000 mg by mouth daily.    [provider]  ibuprofen (ADVIL,MOTRIN) 200 MG tablet Take 400 mg by mouth every 6 (six) hours as needed (for pain/headache.).    [provider]  Multiple Vitamin (MULTIVITAMIN WITH MINERALS) TABS tablet Take 1 tablet by mouth daily.    [provider]      Allergies    Lactose intolerance (gi)  and Vibramycin [doxycycline]    Review of Systems   Review of Systems  Physical Exam Updated Vital Signs BP (!) 185/112 (BP Location: Right Arm)   Pulse (!) 105   Temp 98 F (36.7 C)   Resp 17   Ht 5\' 10"  (1.778 m)   Wt 108.9 kg   SpO2 98%   BMI 34.44 kg/m  Physical Exam Vitals and nursing note reviewed.  Constitutional:      General: He is not in acute distress.    Appearance: He is well-developed.  HENT:     Head: Normocephalic and atraumatic.     Right Ear: External ear normal.     Left Ear: External ear normal.     Nose: Nose normal.  Eyes:     Extraocular Movements: Extraocular movements intact.     Conjunctiva/sclera: Conjunctivae normal.     Pupils: Pupils are equal, round, and reactive to light.  Cardiovascular:     Rate and Rhythm: Normal rate and regular rhythm.     Heart sounds: Normal heart sounds.  Pulmonary:     Effort: Pulmonary effort is normal. No respiratory distress.     Breath sounds: Normal breath sounds.  Musculoskeletal:     Cervical back: Normal range of motion and neck supple.     Right lower leg: No edema.     Left lower leg: No edema.  Skin:  General: Skin is warm and dry.  Neurological:     Mental Status: He is alert. Mental status is at baseline.  Psychiatric:        Mood and Affect: Mood normal.        Behavior: Behavior normal.     ED Results / Procedures / Treatments   Labs (all labs ordered are listed, but only abnormal results are displayed) Labs Reviewed  URINALYSIS, ROUTINE W REFLEX MICROSCOPIC - Abnormal; Notable for the following components:      Result Value   Color, Urine STRAW (*)    Specific Gravity, Urine 1.004 (*)    All other components within normal limits  CBC WITH DIFFERENTIAL/PLATELET  BASIC METABOLIC PANEL    EKG EKG Interpretation Date/Time:  Monday June 19 2023 09:27:45 EST Ventricular Rate:  104 PR Interval:  156 QRS Duration:  88 QT Interval:  342 QTC Calculation: 449 R  Axis:   32  Text Interpretation: Sinus tachycardia Septal infarct , age undetermined Abnormal ECG Confirmed by Vonita Moss [phone]) on 06/19/2023 9:52:30 AM  Radiology No results found.  Procedures Procedures    Medications Ordered in ED Medications  amLODipine (NORVASC) tablet 5 mg (has no administration in time range)    ED Course/ Medical Decision Making/ A&P                                 Medical Decision Making Amount and/or Complexity of Data Reviewed Labs: ordered.  Risk Prescription drug management.   Joshua Craig is a 68 y.o. male with comorbidities that complicate the patient evaluation including asthma/COPD and hypertension on valsartan 320 mg QD who presents to the emergency department with elevated blood pressure.  Initial Ddx:  Hypertensive emergency, ICH, CHF, MI, asymptomatic hypertension  MDM:  Patient presents emergency department with elevated blood pressure.  At this point in time does not have any symptoms of be concerning for hypertensive emergency such as severe headache, chest pain, or shortness of breath.  Will obtain lab work to assess for electrolytes and creatinine so that we can prescribe the proper blood pressure medication.  Plan:  CBC BMP  ED Summary/Re-evaluation:  Lab work returned and was unremarkable.  Will start the patient on amlodipine 5 mg at this time.  Informed about symptoms to look out for.  Will have him continue to take his blood pressure daily and instructed him on how and when to take it.  Will have him follow-up with his primary doctor in several days as well.  This patient presents to the ED for concern of complaints listed in HPI, this involves an extensive number of treatment options, and is a complaint that carries with it a high risk of complications and morbidity. Disposition including potential need for admission considered.   Dispo: DC Home. Return precautions discussed including, but not limited to, those  listed in the AVS. Allowed pt time to ask questions which were answered fully prior to dc.  Additional history obtained from daughter Records reviewed Outpatient Clinic Notes The following labs were independently interpreted: Chemistry and show no acute abnormality I personally reviewed and interpreted cardiac monitoring: normal sinus rhythm  I personally reviewed and interpreted the pt's EKG: see above for interpretation  I have reviewed the patients home medications and made adjustments as needed   Final Clinical Impression(s) / ED Diagnoses Final diagnoses:  Uncontrolled hypertension    Rx / DC Orders ED  Discharge Orders     None         Rondel Baton, MD 06/19/23 1027

## 2023-06-19 NOTE — Discharge Instructions (Signed)
You were seen for your high blood pressure (hypertension) in the emergency department.   At home, please take the medications that you were prescribed for your blood pressure.    Check your MyChart online for the results of any tests that had not resulted by the time you left the emergency department.   Follow-up with your primary doctor in 2-3 days regarding your visit.    Return immediately to the emergency department if you experience any of the following: Severe headache, vision changes, numbness or weakness of your arms or legs, difficulty breathing, chest pain, or any other concerning symptoms.    Thank you for visiting our Emergency Department. It was a pleasure taking care of you today.

## 2023-06-19 NOTE — ED Triage Notes (Addendum)
Pt. Stated, I was suppose to go to [facility] and my BP is too high. I think my BP medicine is not working.I dont have any symptoms.

## 2023-12-18 DIAGNOSIS — E7800 Pure hypercholesterolemia, unspecified: Secondary | ICD-10-CM | POA: Diagnosis not present

## 2023-12-18 DIAGNOSIS — I10 Essential (primary) hypertension: Secondary | ICD-10-CM | POA: Diagnosis not present

## 2023-12-18 DIAGNOSIS — R978 Other abnormal tumor markers: Secondary | ICD-10-CM | POA: Diagnosis not present

## 2023-12-22 DIAGNOSIS — E7800 Pure hypercholesterolemia, unspecified: Secondary | ICD-10-CM | POA: Diagnosis not present

## 2023-12-22 DIAGNOSIS — Z6833 Body mass index (BMI) 33.0-33.9, adult: Secondary | ICD-10-CM | POA: Diagnosis not present

## 2023-12-22 DIAGNOSIS — I10 Essential (primary) hypertension: Secondary | ICD-10-CM | POA: Diagnosis not present

## 2023-12-22 DIAGNOSIS — Z0000 Encounter for general adult medical examination without abnormal findings: Secondary | ICD-10-CM | POA: Diagnosis not present

## 2023-12-22 DIAGNOSIS — J4540 Moderate persistent asthma, uncomplicated: Secondary | ICD-10-CM | POA: Diagnosis not present

## 2023-12-22 DIAGNOSIS — J449 Chronic obstructive pulmonary disease, unspecified: Secondary | ICD-10-CM | POA: Diagnosis not present
